# Patient Record
Sex: Female | Born: 1995 | Marital: Single | Smoking: Never smoker
Health system: Southern US, Community
[De-identification: ages and names within clinical notes are randomized; demographics above are authoritative.]

---

## 2016-03-17 ENCOUNTER — Ambulatory Visit (INDEPENDENT_AMBULATORY_CARE_PROVIDER_SITE_OTHER): Payer: BLUE CROSS/BLUE SHIELD | Admitting: Family Medicine

## 2016-03-17 ENCOUNTER — Encounter: Payer: Self-pay | Admitting: Family Medicine

## 2016-03-17 DIAGNOSIS — M25559 Pain in unspecified hip: Secondary | ICD-10-CM

## 2016-03-17 DIAGNOSIS — G8929 Other chronic pain: Secondary | ICD-10-CM

## 2016-03-17 DIAGNOSIS — R200 Anesthesia of skin: Secondary | ICD-10-CM

## 2016-03-17 DIAGNOSIS — R202 Paresthesia of skin: Secondary | ICD-10-CM

## 2016-03-21 ENCOUNTER — Ambulatory Visit
Admission: RE | Admit: 2016-03-21 | Discharge: 2016-03-21 | Disposition: A | Discharge status: Home / Self Care | Payer: BLUE CROSS/BLUE SHIELD | Source: Ambulatory Visit | Attending: Family Medicine | Admitting: Family Medicine

## 2016-03-21 ENCOUNTER — Encounter: Payer: Self-pay | Admitting: Radiology

## 2016-03-21 DIAGNOSIS — R200 Anesthesia of skin: Secondary | ICD-10-CM

## 2016-03-21 DIAGNOSIS — M25559 Pain in unspecified hip: Secondary | ICD-10-CM | POA: Insufficient documentation

## 2016-03-21 DIAGNOSIS — G8929 Other chronic pain: Secondary | ICD-10-CM | POA: Diagnosis not present

## 2016-03-21 DIAGNOSIS — R202 Paresthesia of skin: Secondary | ICD-10-CM | POA: Diagnosis not present

## 2016-04-04 NOTE — Progress Notes (Signed)
Softball catcher presents today with symptoms of discomfort when doing lunges in the weight room and both legs laterally. Patient states that she's had this problem since she was younger. She states that it only happens with doing lunges. It does not happen when she is in her stats to catch. She states the symptoms do not go past her knees. She denies any lower back pain or any inguinal/groin pain. She states that her symptoms resolve within seconds after getting out of the lunge position.  ROS: Negative except mentioned above.  Vitals as per Epic.  GENERAL: NAD RESP: CTA B CARD: RRR MSK: No back midline tenderness, full range of motion of back and hips, negative straight leg raise, negative Faber's, 5 out of 5 strength of lower extremities, normal gait, NV intact NEURO: CN II-XII grossly intact   A/P: Radicular symptoms lower extremities with no back or hip pain - unclear as to the etiology, will do imaging of the lower back and hips, for now I would recommend that the patient avoid doing the action that causes the symptoms. I do recommend that she work on hamstring flexibility and glut. strength as well as course strength. Seek medical attention if any changes in symptoms or worsening symptoms.

## 2016-09-05 ENCOUNTER — Other Ambulatory Visit: Payer: Self-pay | Admitting: Family Medicine

## 2016-09-05 ENCOUNTER — Ambulatory Visit
Admission: RE | Admit: 2016-09-05 | Discharge: 2016-09-05 | Disposition: A | Discharge status: Home / Self Care | Payer: BLUE CROSS/BLUE SHIELD | Source: Ambulatory Visit | Attending: Family Medicine | Admitting: Family Medicine

## 2016-09-05 DIAGNOSIS — W2107XA Struck by softball, initial encounter: Secondary | ICD-10-CM | POA: Insufficient documentation

## 2016-09-05 DIAGNOSIS — T1490XA Injury, unspecified, initial encounter: Secondary | ICD-10-CM

## 2016-09-05 DIAGNOSIS — S8991XA Unspecified injury of right lower leg, initial encounter: Secondary | ICD-10-CM | POA: Diagnosis present

## 2016-10-26 ENCOUNTER — Ambulatory Visit
Admission: RE | Admit: 2016-10-26 | Discharge: 2016-10-26 | Disposition: A | Discharge status: Home / Self Care | Payer: BLUE CROSS/BLUE SHIELD | Source: Ambulatory Visit | Attending: Family Medicine | Admitting: Family Medicine

## 2016-10-26 ENCOUNTER — Other Ambulatory Visit: Payer: Self-pay | Admitting: Family Medicine

## 2016-10-26 ENCOUNTER — Ambulatory Visit
Admission: AD | Admit: 2016-10-26 | Discharge: 2016-10-26 | Disposition: A | Discharge status: Home / Self Care | Payer: BLUE CROSS/BLUE SHIELD | Source: Ambulatory Visit | Attending: Diagnostic Radiology | Admitting: Diagnostic Radiology

## 2016-10-26 DIAGNOSIS — X58XXXA Exposure to other specified factors, initial encounter: Secondary | ICD-10-CM

## 2016-10-26 DIAGNOSIS — S8991XA Unspecified injury of right lower leg, initial encounter: Secondary | ICD-10-CM | POA: Diagnosis present

## 2016-10-26 DIAGNOSIS — W51XXXA Accidental striking against or bumped into by another person, initial encounter: Secondary | ICD-10-CM | POA: Diagnosis not present

## 2016-10-26 DIAGNOSIS — Y929 Unspecified place or not applicable: Secondary | ICD-10-CM | POA: Diagnosis not present

## 2016-10-26 DIAGNOSIS — M25561 Pain in right knee: Secondary | ICD-10-CM | POA: Insufficient documentation

## 2016-10-26 DIAGNOSIS — Y998 Other external cause status: Secondary | ICD-10-CM | POA: Diagnosis not present

## 2016-10-26 DIAGNOSIS — Y9364 Activity, baseball: Secondary | ICD-10-CM | POA: Diagnosis not present

## 2016-10-26 DIAGNOSIS — Z5321 Procedure and treatment not carried out due to patient leaving prior to being seen by health care provider: Secondary | ICD-10-CM | POA: Insufficient documentation

## 2016-10-26 NOTE — ED Triage Notes (Addendum)
Pt states that she was playing softball, sliding into 3rd base and her rt leg was tucked underneath her left with sliding and jammed into the opposing player, pt is having rt outer knee pain, pt was ambulatory to triage with the use of crutches, pt is semi weight bearing at this time, rt leg wrapped prior to arrival by her trainer with an ace bandage. No distress noted at this time, pt declined wheelchair

## 2016-10-27 ENCOUNTER — Other Ambulatory Visit: Payer: Self-pay | Admitting: Family Medicine

## 2016-10-27 DIAGNOSIS — M25561 Pain in right knee: Secondary | ICD-10-CM

## 2016-10-28 ENCOUNTER — Encounter: Payer: Self-pay | Admitting: Family Medicine

## 2016-10-28 ENCOUNTER — Ambulatory Visit (INDEPENDENT_AMBULATORY_CARE_PROVIDER_SITE_OTHER): Payer: BLUE CROSS/BLUE SHIELD | Admitting: Family Medicine

## 2016-10-28 DIAGNOSIS — S8991XD Unspecified injury of right lower leg, subsequent encounter: Secondary | ICD-10-CM

## 2016-10-28 NOTE — Progress Notes (Signed)
Patient presents today for injury to the right knee. Patient's injury happened 2 days ago when sliding into third base playing softball. Patient was unable to weight-bear after the injury. She continues to be unable to weight-bear without his comfort. She has had clicking and popping of the knee since the injury. There is some bruising of the knee and also swelling. Range of motion is limited with flexion according to patient. She denies any injury to the right knee in the past. She does feel some instability of the knee when she does try to weight-bear/walk. She has seen Dr. Ardine Eng, our orthopedic surgeon at [facility], who suggests getting an MRI to look for ligamentous/cartilage injury. He has prescribed her prescription medication for pain. Has been working with training staff to control swelling with [employer].  ROS: Negative except mentioned above. Vitals as per Epic. GENERAL: NAD MSK: R Knee - mild ecchymosis certain areas laterally, mild effusion, limited range of motion with flexion, mild medial and lateral joint line tenderness, no significant laxity with varus or valgus stress, unable to assess for positive Lachman, Drawer, NV intact NEURO: CN II-XII grossly intact   A/P: Right knee injury/trauma - rest, ice, compression, weightbearing as tolerated, x-rays were negative, we'll proceed with MRI as requested by orthopedic surgeon to look for ligamentous/cartilage injury, take prescription medication as needed, patient will seek medical attention if any acute problems.

## 2016-10-29 ENCOUNTER — Ambulatory Visit
Admission: RE | Admit: 2016-10-29 | Discharge: 2016-10-29 | Disposition: A | Discharge status: Home / Self Care | Payer: BLUE CROSS/BLUE SHIELD | Source: Ambulatory Visit | Attending: Family Medicine | Admitting: Family Medicine

## 2016-10-29 DIAGNOSIS — M25461 Effusion, right knee: Secondary | ICD-10-CM | POA: Insufficient documentation

## 2016-10-29 DIAGNOSIS — M25561 Pain in right knee: Secondary | ICD-10-CM | POA: Diagnosis present

## 2016-11-25 ENCOUNTER — Emergency Department
Admission: EM | Admit: 2016-11-25 | Discharge: 2016-11-25 | Disposition: A | Discharge status: Home / Self Care | Payer: BLUE CROSS/BLUE SHIELD | Attending: Emergency Medicine | Admitting: Emergency Medicine

## 2017-04-03 ENCOUNTER — Ambulatory Visit
Admission: RE | Admit: 2017-04-03 | Discharge: 2017-04-03 | Disposition: A | Discharge status: Home / Self Care | Payer: BLUE CROSS/BLUE SHIELD | Source: Ambulatory Visit | Attending: Family Medicine | Admitting: Family Medicine

## 2017-04-03 ENCOUNTER — Ambulatory Visit (INDEPENDENT_AMBULATORY_CARE_PROVIDER_SITE_OTHER): Payer: BLUE CROSS/BLUE SHIELD | Admitting: Family Medicine

## 2017-04-03 ENCOUNTER — Encounter: Payer: Self-pay | Admitting: Family Medicine

## 2017-04-03 DIAGNOSIS — M25521 Pain in right elbow: Secondary | ICD-10-CM | POA: Insufficient documentation

## 2017-04-03 MED ORDER — DICLOFENAC SODIUM 75 MG PO TBEC: 75.0000 mg | DELAYED_RELEASE_TABLET | Freq: Two times a day (BID) | ORAL | 0 refills | Status: AC

## 2017-04-06 ENCOUNTER — Other Ambulatory Visit: Payer: Self-pay | Admitting: Family Medicine

## 2017-04-06 DIAGNOSIS — M25521 Pain in right elbow: Secondary | ICD-10-CM

## 2017-04-07 NOTE — Progress Notes (Signed)
Patient presents today with symptoms of right medial elbow pain. Patient states that she's had symptoms since the spring of this year. The trainer treated her with stretching of the flexor muscle group and strengthening of the forearm. Patient did not do any significant activity during the summer and then started softball practice when she came back to [facility] for the fall. She has noticed that with throwing the ball from the side (valgus stress) she continues to have medial elbow pain and now has started to feel numbness going into her forearm and fourth and fifth digits. She admits to weakness of the forearm as well. She denies any shoulder pain or neck pain. She has felt a popping that happens in the medial aspect of her elbow at times. She has not had any imaging of her elbow.  ROS: Negative except mentioned above. Vitals as per Epic.  GENERAL: NAD MSK: R Elbow - no obvious deformity, FROM, no tenderness on medial or lateral epicondyle, patient does have some discomfort with valgus stress and milking maneuver, no significant instability appreciated, positive Tinel's, NV intact. She has no tenderness at the shoulder or cervical spine NEURO: CN II-XII grossly intact   A/P: Right medial elbow pain -unsure whether patient is having subluxation episodes, modify activities and throwing for now , can do activities that do not cause pain, NSAIDs prescribed, will do imaging with x-ray and MRI, will follow up with Dr. Ardine Eng after images have been reviewed.

## 2017-04-13 ENCOUNTER — Ambulatory Visit
Admission: RE | Admit: 2017-04-13 | Discharge: 2017-04-13 | Disposition: A | Discharge status: Home / Self Care | Payer: BLUE CROSS/BLUE SHIELD | Source: Ambulatory Visit | Attending: Family Medicine | Admitting: Family Medicine

## 2017-04-13 DIAGNOSIS — M25521 Pain in right elbow: Secondary | ICD-10-CM | POA: Diagnosis present

## 2017-05-12 ENCOUNTER — Encounter: Payer: Self-pay | Admitting: Emergency Medicine

## 2017-05-12 ENCOUNTER — Emergency Department
Admission: EM | Admit: 2017-05-12 | Discharge: 2017-05-13 | Disposition: A | Discharge status: Home / Self Care | Payer: BLUE CROSS/BLUE SHIELD | Attending: Emergency Medicine | Admitting: Emergency Medicine

## 2017-05-12 DIAGNOSIS — Z79899 Other long term (current) drug therapy: Secondary | ICD-10-CM | POA: Diagnosis not present

## 2017-05-12 DIAGNOSIS — R509 Fever, unspecified: Secondary | ICD-10-CM | POA: Diagnosis present

## 2017-05-12 DIAGNOSIS — R197 Diarrhea, unspecified: Secondary | ICD-10-CM | POA: Diagnosis not present

## 2017-05-12 DIAGNOSIS — R1010 Upper abdominal pain, unspecified: Secondary | ICD-10-CM | POA: Insufficient documentation

## 2017-05-12 DIAGNOSIS — J111 Influenza due to unidentified influenza virus with other respiratory manifestations: Secondary | ICD-10-CM

## 2017-05-12 DIAGNOSIS — R69 Illness, unspecified: Secondary | ICD-10-CM

## 2017-05-12 LAB — URINALYSIS, ROUTINE W REFLEX MICROSCOPIC
BILIRUBIN URINE: NEGATIVE
Glucose, UA: NEGATIVE mg/dL
HGB URINE DIPSTICK: NEGATIVE
Ketones, ur: 5 mg/dL — AB
Leukocytes, UA: NEGATIVE
Nitrite: NEGATIVE
Protein, ur: NEGATIVE mg/dL
SPECIFIC GRAVITY, URINE: 1.004 — AB (ref 1.005–1.030)
pH: 7 (ref 5.0–8.0)

## 2017-05-12 LAB — CBC WITH DIFFERENTIAL/PLATELET
BASOS ABS: 0 10*3/uL (ref 0–0.1)
BASOS PCT: 0 %
EOS ABS: 0 10*3/uL (ref 0–0.7)
Eosinophils Relative: 1 %
HCT: 39.1 % (ref 35.0–47.0)
HEMOGLOBIN: 13.5 g/dL (ref 12.0–16.0)
Lymphocytes Relative: 20 %
Lymphs Abs: 0.8 10*3/uL — ABNORMAL LOW (ref 1.0–3.6)
MCH: 30.7 pg (ref 26.0–34.0)
MCHC: 34.6 g/dL (ref 32.0–36.0)
MCV: 88.5 fL (ref 80.0–100.0)
Monocytes Absolute: 0.6 10*3/uL (ref 0.2–0.9)
Monocytes Relative: 15 %
NEUTROS ABS: 2.6 10*3/uL (ref 1.4–6.5)
NEUTROS PCT: 64 %
Platelets: 256 10*3/uL (ref 150–440)
RBC: 4.41 MIL/uL (ref 3.80–5.20)
RDW: 13.5 % (ref 11.5–14.5)
WBC: 4 10*3/uL (ref 3.6–11.0)

## 2017-05-12 LAB — COMPREHENSIVE METABOLIC PANEL
ALK PHOS: 58 U/L (ref 38–126)
ALT: 18 U/L (ref 14–54)
ANION GAP: 12 (ref 5–15)
AST: 30 U/L (ref 15–41)
Albumin: 3.6 g/dL (ref 3.5–5.0)
BILIRUBIN TOTAL: 0.8 mg/dL (ref 0.3–1.2)
BUN: <5 mg/dL — ABNORMAL LOW (ref 6–20)
CALCIUM: 8.6 mg/dL — AB (ref 8.9–10.3)
CO2: 24 mmol/L (ref 22–32)
Chloride: 97 mmol/L — ABNORMAL LOW (ref 101–111)
Creatinine, Ser: 1.08 mg/dL — ABNORMAL HIGH (ref 0.44–1.00)
GFR calc non Af Amer: >60 mL/min (ref >60)
Glucose, Bld: 102 mg/dL — ABNORMAL HIGH (ref 65–99)
POTASSIUM: 3.1 mmol/L — AB (ref 3.5–5.1)
Sodium: 133 mmol/L — ABNORMAL LOW (ref 135–145)
TOTAL PROTEIN: 7.9 g/dL (ref 6.5–8.1)

## 2017-05-12 LAB — POCT PREGNANCY, URINE: Preg Test, Ur: NEGATIVE

## 2017-05-12 LAB — LIPASE, BLOOD: LIPASE: 23 U/L (ref 11–51)

## 2017-05-12 LAB — POCT RAPID STREP A: Streptococcus, Group A Screen (Direct): NEGATIVE

## 2017-05-12 MED ORDER — GI COCKTAIL ~~LOC~~
30.0000 mL | ORAL | Status: AC
  Administered 2017-05-13: 30 mL via ORAL
  Filled 2017-05-12: qty 30

## 2017-05-12 MED ORDER — ACETAMINOPHEN 325 MG PO TABS
650.0000 mg | ORAL_TABLET | Freq: Once | ORAL | Status: AC
  Administered 2017-05-12: 650 mg via ORAL
  Filled 2017-05-12: qty 2

## 2017-05-12 MED ORDER — SODIUM CHLORIDE 0.9 % IV BOLUS (SEPSIS)
1000.0000 mL | Freq: Once | INTRAVENOUS | Status: AC
  Administered 2017-05-12: 1000 mL via INTRAVENOUS

## 2017-05-12 MED ORDER — SODIUM CHLORIDE 0.9 % IV BOLUS (SEPSIS)
1000.0000 mL | Freq: Once | INTRAVENOUS | Status: AC
  Administered 2017-05-13: 1000 mL via INTRAVENOUS

## 2017-05-12 MED ORDER — FAMOTIDINE 20 MG PO TABS
40.0000 mg | ORAL_TABLET | Freq: Once | ORAL | Status: AC
  Administered 2017-05-13: 40 mg via ORAL
  Filled 2017-05-12: qty 2

## 2017-05-12 MED ORDER — ONDANSETRON HCL 4 MG/2ML IJ SOLN
4.0000 mg | Freq: Once | INTRAMUSCULAR | Status: AC
  Administered 2017-05-13: 4 mg via INTRAVENOUS
  Filled 2017-05-12: qty 2

## 2017-05-12 NOTE — ED Triage Notes (Signed)
Pt reports fever since Saturday, abdominal pain and diarrhea all day not being able to keep anything down. Pt denies any other symptom pt talks in complete sentences no distress noted

## 2017-05-13 MED ORDER — FAMOTIDINE 20 MG PO TABS: 20.0000 mg | ORAL_TABLET | Freq: Two times a day (BID) | ORAL | 0 refills | Status: AC

## 2017-05-13 MED ORDER — NAPROXEN 500 MG PO TABS: 500.0000 mg | ORAL_TABLET | Freq: Two times a day (BID) | ORAL | 0 refills | Status: DC

## 2017-05-13 MED ORDER — ALUMINUM-MAGNESIUM-SIMETHICONE 200-200-20 MG/5ML PO SUSP: 30.0000 mL | Freq: Three times a day (TID) | ORAL | 0 refills | Status: AC

## 2017-05-13 MED ORDER — ONDANSETRON 4 MG PO TBDP: 4.0000 mg | ORAL_TABLET | Freq: Three times a day (TID) | ORAL | 0 refills | Status: DC | PRN

## 2017-05-13 NOTE — ED Notes (Signed)
Pt given gingerale for PO challenge 

## 2017-05-13 NOTE — ED Notes (Signed)

## 2017-05-13 NOTE — ED Provider Notes (Signed)
[facility] Emergency Department Provider Note  ____________________________________________  Time seen: Approximately 12:58 AM  I have reviewed the triage vital signs and the nursing notes.   HISTORY  Chief Complaint Fever and Diarrhea    HPI Jaime Steele is a 21 y.o. female who complains of upper abdominal pain, nausea without vomiting, diarrhea for the past 2 days. Also has rhinorrhea and nonproductive cough sore throat and body aches. She went to student health that he a lot and was told she had a viral illness, but today she developed a fever to 103 at home. Denies headache or neck stiffness. No chest pain shortness of breath. No rash. No dysuria frequency urgency.  Symptoms are constant, no aggravating or alleviating factors, moderate intensity.     History reviewed. No pertinent past medical history.   There are no active problems to display for this patient.    History reviewed. No pertinent surgical history.   Prior to Admission medications   Medication Sig Start Date End Date Taking? Authorizing Provider  aluminum-magnesium hydroxide-simethicone (MAALOX) 200-200-20 MG/5ML SUSP Take 30 mLs 4 (four) times daily -  before meals and at bedtime by mouth. 05/13/17   Sharman CheekStafford, Hodan Wurtz, MD  diclofenac (VOLTAREN) 75 MG EC tablet Take 1 tablet (75 mg total) by mouth 2 (two) times daily. 04/03/17   Jolene ProvostPatel, Kirtida, MD  famotidine (PEPCID) 20 MG tablet Take 1 tablet (20 mg total) 2 (two) times daily by mouth. 05/13/17   Sharman CheekStafford, Ejay Lashley, MD  naproxen (NAPROSYN) 500 MG tablet Take 1 tablet (500 mg total) 2 (two) times daily with a meal by mouth. 05/13/17   Sharman CheekStafford, Ladarrian Asencio, MD  ondansetron (ZOFRAN ODT) 4 MG disintegrating tablet Take 1 tablet (4 mg total) every 8 (eight) hours as needed by mouth for nausea or vomiting. 05/13/17   Sharman CheekStafford, Dhiren Azimi, MD     Allergies Patient has no known allergies.   No family history on file.  Social History Social  History   Tobacco Use  . Smoking status: Never Smoker  . Smokeless tobacco: Never Used  Substance Use Topics  . Alcohol use: No  . Drug use: No    Review of Systems  Constitutional:  positive as above fever.  ENT:   positive sore throat and rhinorrhea Cardiovascular:   No chest pain or syncope. Respiratory:   No dyspnea positive nonproductivecough. Gastrointestinal:   positive as above upper abdominal pain and diarrhea. No vomiting.  Musculoskeletal:   Negative for focal pain or swelling All other systems reviewed and are negative except as documented above in ROS and HPI.  ____________________________________________   PHYSICAL EXAM:  VITAL SIGNS: ED Triage Vitals  Enc Vitals Group     BP 05/12/17 1939 (!) 92/49     Pulse Rate 05/12/17 1939 (!) 131     Resp 05/12/17 1939 20     Temp 05/12/17 1939 (!) 102.7 F (39.3 C)     Temp Source 05/12/17 1939 Oral     SpO2 05/12/17 1939 99 %     Weight 05/12/17 1939 180 lb (81.6 kg)     Height 05/12/17 1939 5\' 6"  (1.676 m)     Head Circumference --      Peak Flow --      Pain Score 05/12/17 1938 7     Pain Loc --      Pain Edu? --      Excl. in GC? --     Vital signs reviewed, nursing assessments reviewed.   Constitutional:  Alert and oriented. Well appearing and in no distress. Eyes:   No scleral icterus.  EOMI. No nystagmus. No conjunctival pallor. PERRL. ENT   Head:   Normocephalic and atraumatic.   Nose:   No congestion/rhinnorhea.    Mouth/Throat:   MMM, no pharyngeal erythema. No peritonsillar mass.    Neck:   No meningismus. Full ROM. Hematological/Lymphatic/Immunilogical:   No cervical lymphadenopathy. Cardiovascular:   tachycardia heart rate 100. Symmetric bilateral radial and DP pulses.  No murmurs.  Respiratory:   Normal respiratory effort without tachypnea/retractions. Breath sounds are clear and equal bilaterally. No wheezes/rales/rhonchi. Gastrointestinal:   Soft with left upper quadrant  tenderness. Non distended. There is no CVA tenderness.  No rebound, rigidity, or guarding.no organomegaly Genitourinary:   deferred Musculoskeletal:   Normal range of motion in all extremities. No joint effusions.  No lower extremity tenderness.  No edema. Neurologic:   Normal speech and language.  Motor grossly intact. No gross focal neurologic deficits are appreciated.  Skin:    Skin is warm, dry and intact. No rash noted.  No petechiae, purpura, or bullae.  ____________________________________________    LABS (pertinent positives/negatives) (all labs ordered are listed, but only abnormal results are displayed) Labs Reviewed  COMPREHENSIVE METABOLIC PANEL - Abnormal; Notable for the following components:      Result Value   Sodium 133 (*)    Potassium 3.1 (*)    Chloride 97 (*)    Glucose, Bld 102 (*)    BUN <5 (*)    Creatinine, Ser 1.08 (*)    Calcium 8.6 (*)    All other components within normal limits  CBC WITH DIFFERENTIAL/PLATELET - Abnormal; Notable for the following components:   Lymphs Abs 0.8 (*)    All other components within normal limits  URINALYSIS, ROUTINE W REFLEX MICROSCOPIC - Abnormal; Notable for the following components:   Color, Urine YELLOW (*)    APPearance CLEAR (*)    Specific Gravity, Urine 1.004 (*)    Ketones, ur 5 (*)    All other components within normal limits  LIPASE, BLOOD  POC URINE PREG, ED  POCT PREGNANCY, URINE  POCT RAPID STREP A   ____________________________________________   EKG    ____________________________________________    RADIOLOGY  No results found.  ____________________________________________   PROCEDURES Procedures  ____________________________________________     CLINICAL IMPRESSION / ASSESSMENT AND PLAN / ED COURSE  Pertinent labs & imaging results that were available during my care of the patient were reviewed by me and considered in my medical decision making (see chart for  details).   patient well appearing no acute distress, presents with fever and tachycardia that is congruent. Constellation of symptoms highly consistent with a viral syndrome, influenza-like illness. Abdomen overall is benign and reassuring. Low suspicion of STI PID TOA appendicitis pancreatitis cholecystitis or intra-abdominal abscess or peritonitis. No evidence of soft tissue infection, pneumonia, meningitis or encephalitis. Counseled patient on symptom control, hydration. Vital signs normalized after antipyretics and IV fluids in the ED and patient feels much better and is tolerating oral intake.      ____________________________________________   FINAL CLINICAL IMPRESSION(S) / ED DIAGNOSES    Final diagnoses:  Influenza-like illness  Fever, unspecified fever cause      This SmartLink is deprecated. Use AVSMEDLIST instead to display the medication list for a patient.   Portions of this note were generated with dragon dictation software. Dictation errors may occur despite best attempts at proofreading.    Sharman CheekStafford, Irini Leet, MD  05/13/17 0103  

## 2017-05-15 ENCOUNTER — Ambulatory Visit (INDEPENDENT_AMBULATORY_CARE_PROVIDER_SITE_OTHER): Payer: BLUE CROSS/BLUE SHIELD | Admitting: Family Medicine

## 2017-05-15 ENCOUNTER — Encounter: Payer: Self-pay | Admitting: Family Medicine

## 2017-05-15 VITALS — BP 127/78 | HR 102 | Temp 98.1°F | Resp 14

## 2017-05-15 DIAGNOSIS — J069 Acute upper respiratory infection, unspecified: Secondary | ICD-10-CM

## 2017-05-15 DIAGNOSIS — H6691 Otitis media, unspecified, right ear: Secondary | ICD-10-CM

## 2017-05-15 LAB — POCT INFLUENZA A/B
Influenza A, POC: NEGATIVE
Influenza B, POC: NEGATIVE

## 2017-05-15 MED ORDER — AZITHROMYCIN 250 MG PO TABS: ORAL_TABLET | ORAL | 0 refills | Status: AC

## 2017-05-15 NOTE — Addendum Note (Signed)
Addended by: Dione HousekeeperPATEL, Dorathy Stallone N on: 05/15/2017 09:47 AM   Modules accepted: Orders

## 2017-05-15 NOTE — Progress Notes (Signed)
Patient presents today with symptoms of mild productive cough, postnasal drip, right ear pain. Patient states that she was seen in the ER a few days ago with a high fever and GI symptoms and URI symptoms. Patient was told that she had a virus. A flu screen was not done. Patient states that her temperature now is around 99. She continues to have a bad cough mostly at night. She is now noticed right ear pain which she did not have a few days ago. She denies any significant chest pain or shortness of breath. She states her GI symptoms have resolved. She has been taking Tessalon Perles for her cough. She denies asthma.  ROS: Negative except mentioned above. Vitals as per Epic. GENERAL: NAD HEENT: mild pharyngeal erythema, no exudate, moderate erythema of right TM, no erythema of left TM, no drainage from the ears, no cervical LAD RESP: CTA B CARD: RRR ABD: Positive bowel sounds, nontender, no rebound or guarding appreciated, no flank tenderness NEURO: CN II-XII grossly intact   A/P: Right Otitis Media, URI - influenza test was negative in the office, will treat with Z-Pak, can take Delsym during the daytime for cough and take Tussionex at night if needed. Can try Claritin for postnasal drip. If symptoms do persist or worsen I have recommended that she seek medical attention while she is at home over Thanksgiving break. Patient addresses understanding.

## 2017-08-04 IMAGING — CR DG KNEE COMPLETE 4+V*R*
1 series · 5 of 5 positions shown · non-contrast
Comparison: None.

CLINICAL DATA: Injured playing softball.  Lateral pain.

EXAM:
RIGHT KNEE - COMPLETE 4+ VIEW

[Series 3: t knee ap right · 0.14mm/px · 5 of 5 slices shown]
[im 1/5]
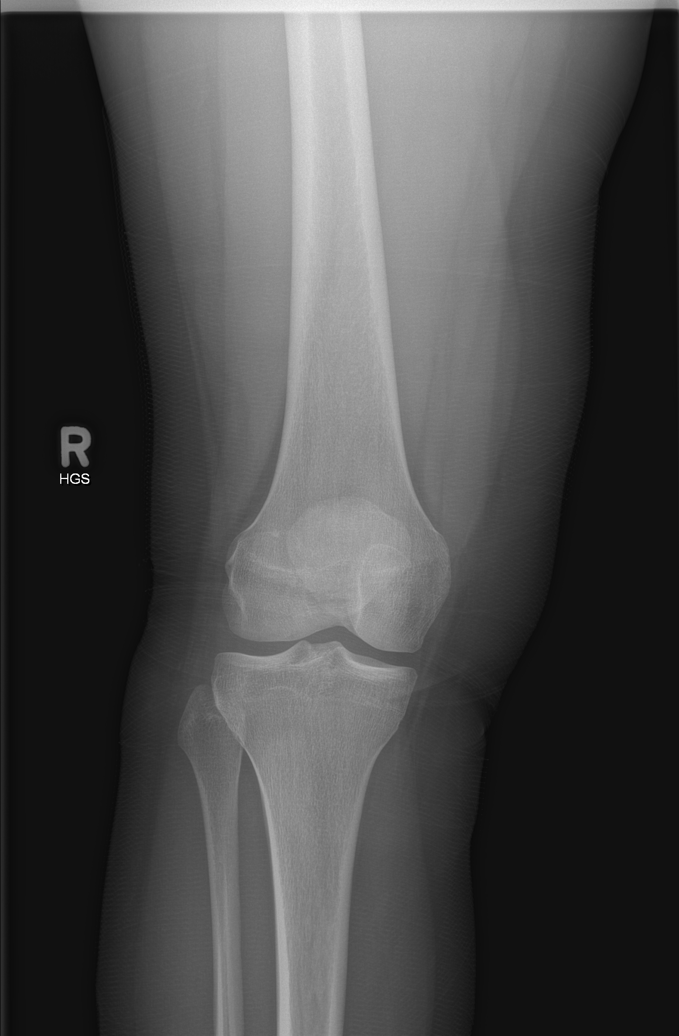
[im 2/5]
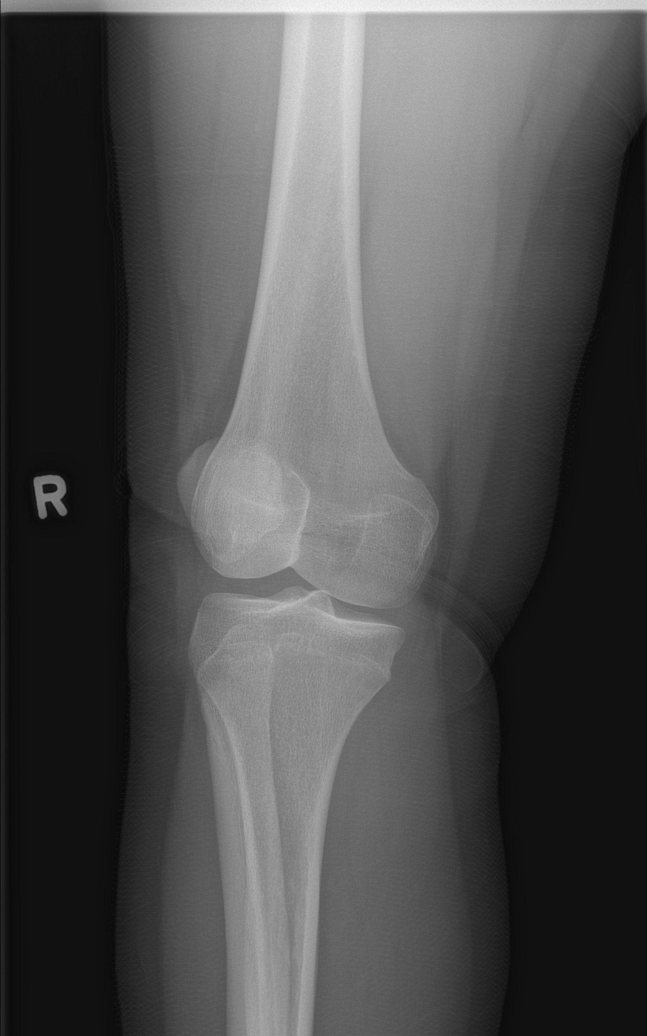
[im 3/5]
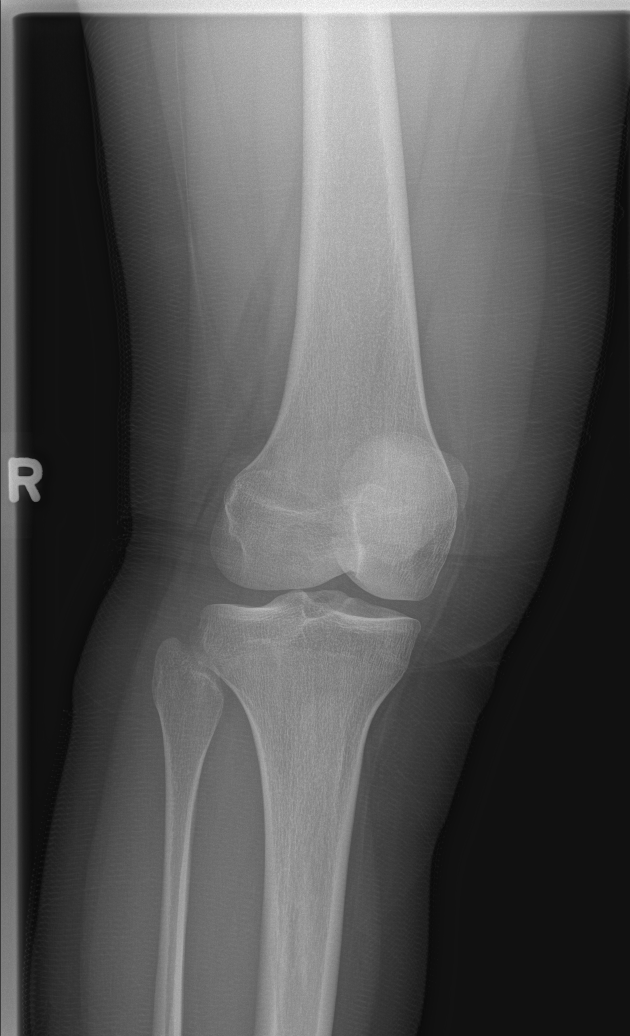
[im 4/5]
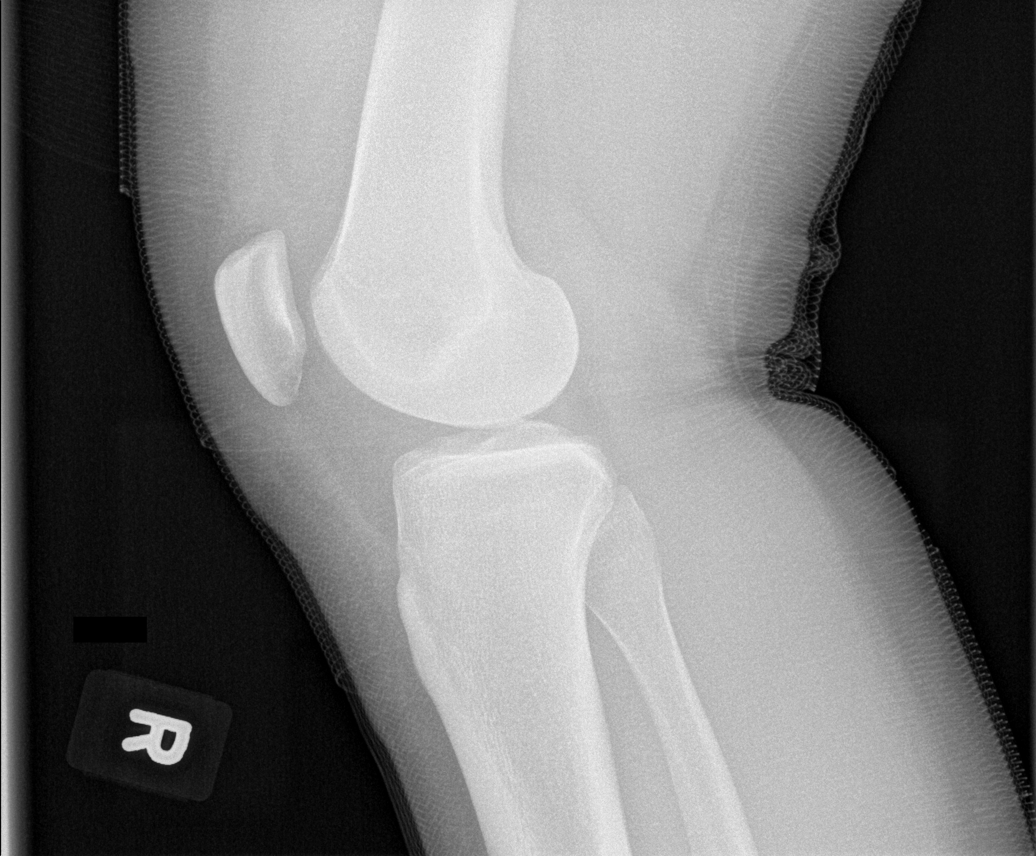
[im 5/5]
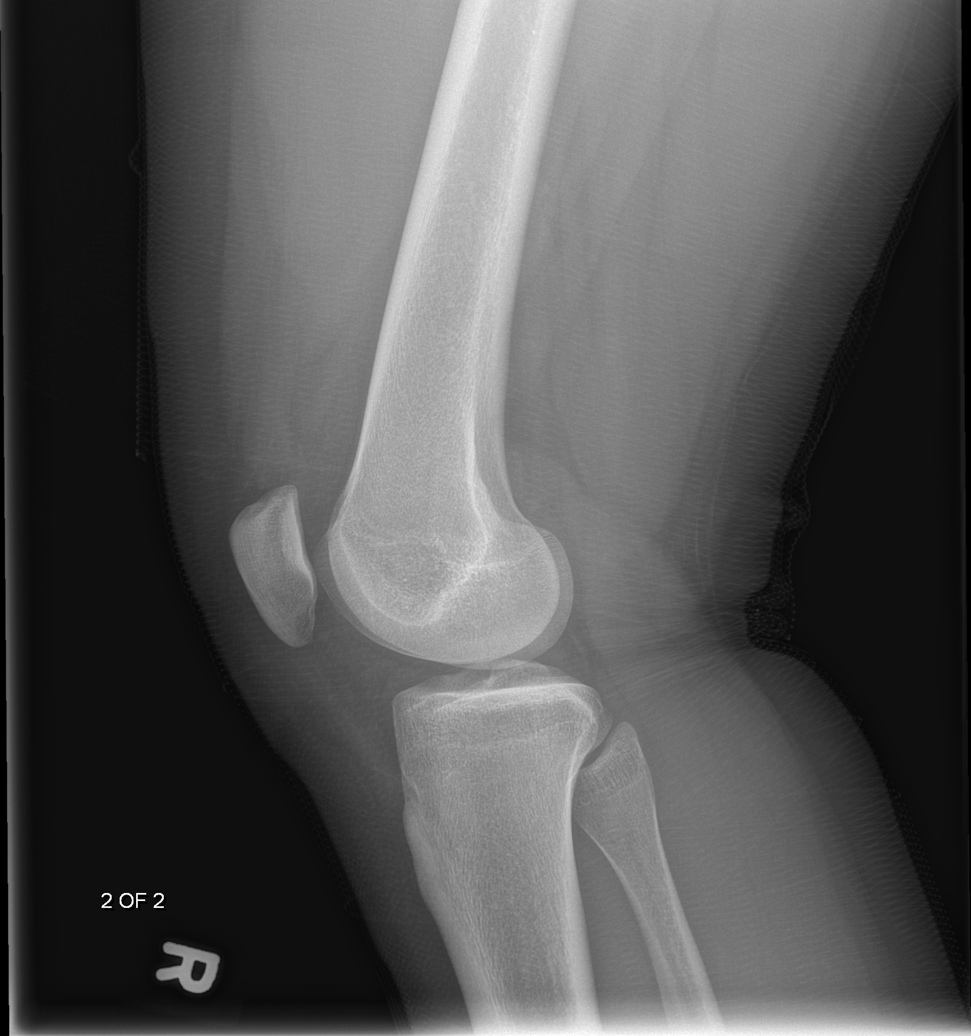

[5 of 5 positions shown; findings below may reference images not displayed]

FINDINGS: No evidence of fracture, dislocation, or joint effusion. No evidence
of arthropathy or other focal bone abnormality. Soft tissues are
unremarkable.
IMPRESSION: Normal

## 2018-06-14 ENCOUNTER — Ambulatory Visit: Payer: BC Managed Care – PPO | Attending: Psychiatry | Admitting: Psychiatry

## 2018-06-14 ENCOUNTER — Encounter (INDEPENDENT_AMBULATORY_CARE_PROVIDER_SITE_OTHER): Payer: Self-pay | Admitting: Psychiatry

## 2018-06-14 ENCOUNTER — Ambulatory Visit
Admission: RE | Admit: 2018-06-14 | Discharge: 2018-06-14 | Disposition: A | Discharge status: Home / Self Care | Payer: BC Managed Care – PPO | Source: Ambulatory Visit | Attending: Psychiatry | Admitting: Psychiatry

## 2018-06-14 ENCOUNTER — Telehealth (INDEPENDENT_AMBULATORY_CARE_PROVIDER_SITE_OTHER): Payer: Self-pay

## 2018-06-14 DIAGNOSIS — Z008 Encounter for other general examination: Secondary | ICD-10-CM | POA: Insufficient documentation

## 2018-06-14 DIAGNOSIS — F99 Mental disorder, not otherwise specified: Secondary | ICD-10-CM | POA: Insufficient documentation

## 2018-06-14 DIAGNOSIS — F902 Attention-deficit hyperactivity disorder, combined type: Secondary | ICD-10-CM | POA: Insufficient documentation

## 2018-06-14 LAB — VH URINE DRUG SCREEN
Amphetamine: NEGATIVE
Barbiturates: NEGATIVE
Cannabinoids: NEGATIVE
Cocaine: NEGATIVE
Methamphetamine: NEGATIVE
Opiates: NEGATIVE
Phencyclidine: NEGATIVE
Propoxyphene: NEGATIVE
Urine Benzodiazepines: NEGATIVE
Urine Buprenorphine: NEGATIVE
Urine Methadone Screen: NEGATIVE
Urine Oxycodone: NEGATIVE
Urine Tricyclics: NEGATIVE

## 2018-06-14 MED ORDER — ATOMOXETINE HCL 40 MG PO CAPS
40.00 mg | ORAL_CAPSULE | Freq: Every day | ORAL | 1 refills | Status: DC
Start: 2018-06-14 — End: 2018-07-27

## 2018-06-14 NOTE — Telephone Encounter (Signed)
-----   Message from Audree Camel, MD sent at 06/14/2018  4:00 PM EST -----  Please let her know this is all negative as she expected.

## 2018-06-14 NOTE — Patient Instructions (Signed)
Atomoxetine capsules  Brand Name: Strattera  What is this medicine?  ATOMOXETINE (AT oh mox e teen) is used to treat attention deficit/hyperactivity disorder, also known as ADHD. It is not a stimulant like other drugs for ADHD. This drug can improve attention span, concentration, and emotional control. It can also reduce restless or overactive behavior.  How should I use this medicine?  Take this medicine by mouth with a glass of water. Follow the directions on the prescription label. You can take it with or without food. If it upsets your stomach, take it with food. If you have difficulty sleeping and you take more than 1 dose per day, take your last dose before 6 PM. Take your medicine at regular intervals. Do not take it more often than directed. Do not stop taking except on your doctor's advice.  A special MedGuide will be given to you by the pharmacist with each prescription and refill. Be sure to read this information carefully each time.  Talk to your pediatrician regarding the use of this medicine in children. While this drug may be prescribed for children as young as 6 years for selected conditions, precautions do apply.  What side effects may I notice from receiving this medicine?  Side effects that you should report to your doctor or health care professional as soon as possible:   allergic reactions like skin rash, itching or hives, swelling of the face, lips, or tongue   breathing problems   chest pain   dark urine   fast, irregular heartbeat   general ill feeling or flu-like symptoms   high blood pressure   males: prolonged or painful erection   stomach pain or tenderness   trouble passing urine or change in the amount of urine   vomiting   weight loss   yellowing of the eyes or skin  Side effects that usually do not require medical attention (report to your doctor or health care professional if they continue or are bothersome):   change in sex drive or performance   constipation or  diarrhea   headache   loss of appetite   menstrual period irregularities   nausea   stomach upset  What may interact with this medicine?  Do not take this medicine with any of the following medications:   cisapride   dofetilide   dronedarone   MAOIs like Carbex, Eldepryl, Marplan, Nardil, and Parnate   pimozide   reboxetine   thioridazine   ziprasidone  This medicine may also interact with the following medications:   certain medicines for blood pressure, heart disease, irregular heart beat   certain medicines for depression, anxiety, or psychotic disturbances   certain medicines for lung disease like albuterol   cold or allergy medicines   fluoxetine   medicines that increase blood pressure like dopamine, dobutamine, or ephedrine   other medicines that prolong the QT interval (cause an abnormal heart rhythm)   paroxetine   quinidine   stimulant medicines for attention disorders, weight loss, or to stay awake  What if I miss a dose?  If you miss a dose, take it as soon as you can. If it is almost time for your next dose, take only that dose. Do not take double or extra doses.  Where should I keep my medicine?  Keep out of the reach of children.  Store at room temperature between 15 and 30 degrees C (59 and 86 degrees F). Throw away any unused medication after the expiration date.    What should I tell my health care provider before I take this medicine?  They need to know if you have any of these conditions:   glaucoma   high or low blood pressure   history of stroke   irregular heartbeat or other cardiac disease   liver disease   mania or bipolar disorder   pheochromocytoma   suicidal thoughts   an unusual or allergic reaction to atomoxetine, other medicines, foods, dyes, or preservatives   pregnant or trying to get pregnant   breast-feeding  What should I watch for while using this medicine?  It may take a week or more for this medicine to take effect. This is why it is very  important to continue taking the medicine and not miss any doses. If you have been taking this medicine regularly for some time, do not suddenly stop taking it. Ask your doctor or health care professional for advice.  Rarely, this medicine may increase thoughts of suicide or suicide attempts in children and teenagers. Call your child's health care professional right away if your child or teenager has new or increased thoughts of suicide or has changes in mood or behavior like becoming irritable or anxious. Regularly monitor your child for these behavioral changes.  For males, contact you doctor or health care professional right away if you have an erection that lasts longer than 4 hours or if it becomes painful. This may be a sign of serious problem and must be treated right away to prevent permanent damage.  You may get drowsy or dizzy. Do not drive, use machinery, or do anything that needs mental alertness until you know how this medicine affects you. Do not stand or sit up quickly, especially if you are an older patient. This reduces the risk of dizzy or fainting spells. Alcohol can make you more drowsy and dizzy. Avoid alcoholic drinks.  Do not treat yourself for coughs, colds or allergies without asking your doctor or health care professional for advice. Some ingredients can increase possible side effects.  Your mouth may get dry. Chewing sugarless gum or sucking hard candy, and drinking plenty of water will help.  NOTE:This sheet is a summary. It may not cover all possible information. If you have questions about this medicine, talk to your doctor, pharmacist, or health care provider. Copyright 2019 [employer]

## 2018-06-14 NOTE — Progress Notes (Signed)
[facility]  Initial Psychiatric Evaluation      Patient name:  Rhonda Pitts  MRN:  [id number]  DOB:  June 17, 1996    06/14/2018    Rhonda Pitts is a 22 y.o. female, presented for initial evaluation.       Chief Complaint   Patient presents with   . Establish Care       History of Present Illness:  This 22 year old single white female [employer] presents to be evaluated for potential ADHD.    She reports no history of depression, no psychosis, no suicidal thoughts.  There is no evidence of hypomania or mania.    She does acknowledge that she feels like she is quite anxious and perhaps more anxious than other people.  She believes that she always worries and that she always worries more than her friends that have things to be concerned about.  She notes that at times her anxiety leads her to have more headaches.  Speaking with her mother, her mother surprised to hear this but does not dispute it.    In terms of ADHD, speaking with her mother by telephone she agrees that there may have always been some element of symptomatology.  Rhonda Pitts did well in high school and got A's and always did well academically.  She channeled most of her energy into sports and softball and did quite well with this.  The patient states that the issues she has in school is typically the classes are much longer.  The longer classes the more trouble she has focusing and maintaining her attention on it.  She also acknowledges that it is harder academically for her.  Rhonda Pitts reported symptoms of ADHD. Rhonda Pitts reported severity of sx is  worsened. Rhonda Pitts reported symptoms occur approximately daily and typical duration is continuous.  In terms of ADHD symptom:  Location & Context         []  Rhonda Pitts reported these symptoms of typically present:        [x]  Rhonda Pitts reported symptoms affect academic ability.        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)       []  Rhonda Pitts reported  symptoms affect relationship with:     Rhonda Pitts reported the following modifying factors:         []  Rhonda Pitts reported no modifying factors       [x]  Rhonda Pitts reported palliative factors include: being devotedly 1:1       [x]  Rhonda Pitts reported provocative factors include: longer classes, longer inactivity, lots of talking    Rhonda Pitts reported the following associated factors:          [x]  making careless mistakes     [x]  difficulty sustaining attention       []  not listening when spoken to     []  tasks or projects are often not finished       []  being disorganized     [x]  difficulty with tasks that require sustained mental effort       [x]  tending to lose things     [x]  being easily distracted     [x]  being often forgetful       []  poor attention to details     [x]  often inattentive     [x]  frequently changing activity       []  work often left incomplete     []  work is messy       [  x] needing frequent redirection to tasks at hand     [x]  short attention span       [x]  being restless     [x]  often out of seat     []  difficultly being quiet       [x]  feeling as if "driven by a motor"      [x]  being over talkative     [x]  being a risk taker       [x]  becoming bored easily     [x]  excessive movement       []  feelings of inner restlessness     []  blurting out answers       [x]  difficulty waiting for own turn     []  interrupting others     [x]  difficulty accepting limits       [x]  leaving seat often     []  often acting recklessly      Substance Abuse: None reported    Past Psych Hx: No prior treatment.     Legal Issues: None reported    Developmental HX: Patient was born and raised in the [place].  She is always done well in high school and did well in athletics and is now playing softball at the [employer].  She has been quite successful in her athletic career.  She has a brother.  She has a remote family history of ADHD.      MSE: Patient is pleasant and cooperative.  She is dressed casually but  neatly and cleanly.  She has eye contact which is very good and she is very polite.  Her thoughts are linear.  Her word choices good and her fund of knowledge is good.  Her mood is a little bit anxious.  Her affect is slightly anxious.  There is no suicidal or homicidal ideation noted.  There is no psychosis.  Her insight and judgment are good.    PHQ9 : 3  CAGE: 0      Vitals:    06/14/18 1355   BP: 128/74   Pulse: 72     Weight: 83.9 kg (185 lb)     Medications on File Prior to visit:    Current Outpatient Medications on File Prior to Visit   Medication Sig Dispense Refill   . norgestimate-ethinyl estradiol (ORTHO-CYCLEN) 0.25-35 MG-MCG per tablet Take 1 tablet by mouth daily       No current facility-administered medications on file prior to visit.      No Known Allergies    History reviewed. No pertinent past medical history.  Family History   Problem Relation Age of Onset   . ADD / ADHD Maternal Grandmother      Social History     Socioeconomic History   . Marital status: Single     Spouse name: Not on file   . Number of children: 0   . Years of education: 43   . Highest education level: High school graduate   Occupational History   . Occupation: Archivist   Social Needs   . Financial resource strain: Not on file   . Food insecurity:     Worry: Not on file     Inability: Not on file   . Transportation needs:     Medical: Not on file     Non-medical: Not on file   Tobacco Use   . Smoking status: Never Smoker   . Smokeless tobacco: Never Used   Substance and Sexual Activity   .  Alcohol use: Yes     Alcohol/week: 2.0 standard drinks     Types: 2 Shots of liquor per week   . Drug use: Never   . Sexual activity: Yes     Partners: Male     Birth control/protection: Pill   Lifestyle   . Physical activity:     Days per week: Not on file     Minutes per session: Not on file   . Stress: Not on file   Relationships   . Social connections:     Talks on phone: Not on file     Gets together: Not on file     Attends  religious service: Not on file     Active member of club or organization: Not on file     Attends meetings of clubs or organizations: Not on file     Relationship status: Not on file   . Intimate partner violence:     Fear of current or ex partner: Not on file     Emotionally abused: Not on file     Physically abused: Not on file     Forced sexual activity: Not on file   Other Topics Concern   . Not on file   Social History Narrative   . Not on file             Assessment - Diagnosis - Goals:   Andie was seen today for establish care.    Diagnoses and all orders for this visit:    Psychiatric problem    ADHD (attention deficit hyperactivity disorder), combined type  -     Urine Drug Screen; Future    Other orders  -     atomoxetine (STRATTERA) 40 MG capsule; Take 1 capsule (40 mg total) by mouth daily        Axis II: none  Axis III: See above  Axis IV: Mild  Axis V: 60                  Treatment Goals:                [] Restore  function and prevent recurrence        [x] Strive to improve function and prevent recurrence or worsening of symptoms        [] Maintain function and prevent recurrence      [] Other:             Plan:       Treatment Plan:    We discussed diagnosis and treatment planning process with client.      Client is capable of understanding the risks and benefits of treatment recommendations, sharing or withholding information.       A substance abuse disorder was not identifed.    Lab work and prescriptions noted above.  Medications Changes proposed:   Trial of Strattera 40 mg in the evening.  She should call in several weeks so that we can increase the dose to 60 mg if this is not effective.  We chose Strattera because her softball schedule makes it seem difficult to manage in a controlled substance treatment.  She also has some anxiousness and worry her to find a treatment that will not make anxiety worse.    Seeking urine drug screen  PMP is appropriate June 14, 2018    Therapy recommended: Not  indicated  Return in about 6 weeks (around 07/26/2018).  Patient is aware to contact office sooner if needed.  Pt education materials provided: [employer]     The potential risks of these medications in pregnancy were discussed.  The patient currently does not desire to become pregnant and  is taking precautions to avoid pregnancy.  If pregnancy occurs or the patient desires to become pregnant she will address this with the clinical staff.  Further she is made aware of the www.womensmentalhealth.org as a resource.    Review with patient:   The nature of this outpatient practice and how to contact us for routine issues as well as how to seek higher levels of care if needed were discussed.  Discussed risks and benefits of treatment options as well as potential side effects and medication interactions. Patient agreed to continue with safety plan which includes calling 911 and/or going to ER if needed. All questions answered and concerns addressed.     Audree Camel, MD      This note was completed using rising Dragon Medical speech recognition software. Grammatical errors, random word insertions, pronoun errors and incomplete sentences are occasional consequences of this technology due to software limitations. If there are questions or concerns about the content of this note or information contained within the body of this dictation they should be addressed with the provider for clarification.

## 2018-07-27 ENCOUNTER — Ambulatory Visit: Payer: BC Managed Care – PPO | Attending: Psychiatry | Admitting: Psychiatry

## 2018-07-27 ENCOUNTER — Encounter (INDEPENDENT_AMBULATORY_CARE_PROVIDER_SITE_OTHER): Payer: Self-pay | Admitting: Psychiatry

## 2018-07-27 DIAGNOSIS — F902 Attention-deficit hyperactivity disorder, combined type: Secondary | ICD-10-CM | POA: Insufficient documentation

## 2018-07-27 MED ORDER — ATOMOXETINE HCL 40 MG PO CAPS
40.00 mg | ORAL_CAPSULE | Freq: Every day | ORAL | 4 refills | Status: DC
Start: 2018-07-27 — End: 2018-11-23

## 2018-07-27 NOTE — Progress Notes (Signed)
[facility]  Follow up visit    Patient name:  Rhonda Pitts  MRN:  [id number]  DOB:  08/08/1995    07/27/2018    Rhonda Pitts is a 23 y.o. female, presented for follow-up appointment.    Others Present in Session include: none    Chief Complaint:    Chief Complaint   Patient presents with    Medication Management     Major Problem: ADHD  Doing quite well with Rhonda Pitts  Focusing better with school, grade on January term much better than anticipated,   UDS- appropriate  This problem is chronic and stable and improving  Issues continue in the context of straterra trial  Issues continue over   Associated Symptoms include no unusual behaviors, sig gains with straterra    Adverse events associated with medication include : none        Wt Readings from Last 3 Encounters:   07/27/18 82.6 kg (182 lb)   06/14/18 83.9 kg (185 lb)     Medications on File Prior to visit:    Current Outpatient Medications on File Prior to Visit   Medication Sig Dispense Refill    [DISCONTINUED] atomoxetine (STRATTERA) 40 MG capsule Take 1 capsule (40 mg total) by mouth daily 30 capsule 1    norgestimate-ethinyl estradiol (ORTHO-CYCLEN) 0.25-35 MG-MCG per tablet Take 1 tablet by mouth daily       No current facility-administered medications on file prior to visit.      No Known Allergies    Past medical history, family history and social history on file, reviewed.     Review Of Systems:     Neurological:  [x]  There is no recent history of h/a, weakness, dizziness or other abnormal neurological symptoms not otherwise discussed above.   []  Abnormality:    Constitutional:  [x]  There is no recent history of fever, malaise, or other abnormal constitutional symptoms not otherwise discussed above.   []  Abnormality:      Current Evaluation:     Mental Status Evaluation:  General/Appearance:  [x] age appropriate    [] bearded    [x] casually dressed       [] defiant     [x] cooperative [] disheveled    [] older than stated age    [] overweight     [] piercings    [] tattooed    [] thin & gaunt looking    [] well dressed    [] younger than stated age     [x] good eye contact    [] avoidant eye contact    [] hesitant   [] Other:    Gait:    [x] normal gait    [] gait abnormality:   Behavior/Psychomotor:    [x] Normal   [] psychomotor agitation    [] psychomotor retardation    [] restless     [] fidgety     [] tics  [] rigidity  [] flaccid       [] akathesia    [] choreaoathetoid movt   [] Other:    Speech:      [x] Normal pitch     [x] normal volume    [] articulation error    [] delayed    [] increased latency of response     [] loud    [] pressured    [] profane     [] soft [] perseveration  [] Other:    Mood:  [x]  Normal     [] angry    [] anxious    [] constricted    [] decreased range     [] depressed    [] dysthymic    [] euphoric    [] euthymic    [] irritable    []   labile     [] sad   [] Other:    Affect:      [x] congruent with mood      [x] full      [] constricted      [] guarded      [] flat      [] blunted      [] expansive   [] Other:    Thought Process /Attention /Concentration:      [x] Normal    []  blocked    [] circumstantial     [] concrete    [] flight of ideas     [x] goal directed    [] loose associations     [] tangential       [] distractable      [] inattentive       [x] associations intact [x]  abstract reasoning intact   Thought Content:     [] delusions    [] obsessions        [x]  absent of suicidal or homocidal ideation   [x] absent of psychotic symptoms  []  A/H     [] paranoia          [] obsessions      [] suicidal ideation      [] suicidal plan      [] suicidal intent      [] passive suicidal ideation      [] homicidal ideation      [] homicidal plan      [] homicidal intent  [] Other:    Perception /Sensorium:         [x] alert     [] drowsy      [x] oriented to person    [x] oriented to place     [x] oriented to time     [x] oriented to situation          [x] does not appear to be reacting to internal or external stimuli today       [] appears to be reacting to internal or external stimuli  [] Other:     Language:  [x] age appropriate    []  naming okay   []  repetition    Fund of knowledge:  [x] age appropriate    [x]  adequate   [] in adequate []  above average   Cognition/Memory:        [x]  cognition grossly intact       []  cognition impaired due to:   []  immediate recall deficit  []  recent memory deficit  [] delayed memory deficit   [] MMSE:   [] MOCA:    Insight:        [x] age appropriate      [x] fair       [] good       [] limited   Judgment:        [x] age appropriate      [x] fair       [] good       [] limited        Assessment  Diagnosis  Goals:      Rhonda Pitts was seen today for medication management.    Diagnoses and all orders for this visit:    ADHD (attention deficit hyperactivity disorder), combined type    Other orders  -     atomoxetine (STRATTERA) 40 MG capsule; Take 1 capsule (40 mg total) by mouth daily                      Treatment Goals:                [] Restore  function and prevent recurrence        [] Strive to improve function and prevent recurrence or  worsening of symptoms        [x] Maintain function and prevent recurrence      [] Other:         Plan:       Treatment Plan:  Cont straterra 40mg  significant benefit        [x]  Continue Medication Management      Associated Prescriptions and orders are noted above.        []  Therapy recommendation: continue   Declined  Strongly encouraged      []  Patient rejected psychotherapy recommendation.         Return in about 4 months (around 11/25/2018).        Review with patient:   Discussed risks and benefits of treatment options as well as potential side effects and medication interactions. We discussed the potential benefits of medications, potential alternatives to medications, and the potential adverse outcomes with or without medications where applicable. Patient agreed to continue with safety plan which includes calling 911 and/or going to ER if needed. All questions answered and concerns addressed.     Audree Camel, MD    This note was completed using   Dragon Medical speech recognition software. Grammatical errors, random word insertions, pronoun errors and incomplete sentences are occasional consequences of this technology due to software limitations. If there are questions or concerns about the content of this note or information contained within the body of this dictation they should be addressed with the provider for clarification.

## 2018-08-03 ENCOUNTER — Ambulatory Visit
Admission: RE | Admit: 2018-08-03 | Discharge: 2018-08-03 | Disposition: A | Discharge status: Home / Self Care | Payer: BLUE CROSS/BLUE SHIELD | Attending: Family Medicine | Admitting: Family Medicine

## 2018-08-03 ENCOUNTER — Other Ambulatory Visit: Payer: Self-pay | Admitting: Family Medicine

## 2018-08-03 ENCOUNTER — Encounter: Payer: Self-pay | Admitting: Family Medicine

## 2018-08-03 ENCOUNTER — Ambulatory Visit
Admission: RE | Admit: 2018-08-03 | Discharge: 2018-08-03 | Disposition: A | Discharge status: Home / Self Care | Payer: BLUE CROSS/BLUE SHIELD | Source: Ambulatory Visit | Attending: Family Medicine | Admitting: Family Medicine

## 2018-08-03 ENCOUNTER — Ambulatory Visit (INDEPENDENT_AMBULATORY_CARE_PROVIDER_SITE_OTHER): Payer: BLUE CROSS/BLUE SHIELD | Admitting: Family Medicine

## 2018-08-03 VITALS — Temp 98.4°F | Resp 14

## 2018-08-03 DIAGNOSIS — M79661 Pain in right lower leg: Secondary | ICD-10-CM | POA: Insufficient documentation

## 2018-08-03 MED ORDER — NAPROXEN 500 MG PO TABS: 500.0000 mg | ORAL_TABLET | Freq: Two times a day (BID) | ORAL | 0 refills | Status: AC

## 2018-08-03 MED ORDER — NAPROXEN 500 MG PO TABS: 500.0000 mg | ORAL_TABLET | Freq: Two times a day (BID) | ORAL | 0 refills | Status: DC

## 2018-08-04 LAB — VITAMIN D 25 HYDROXY (VIT D DEFICIENCY, FRACTURES): Vit D, 25-Hydroxy: 26.8 ng/mL — ABNORMAL LOW (ref 30.0–100.0)

## 2018-08-05 NOTE — Progress Notes (Signed)
Patient presents today with symptoms of right shin pain for the past week.  Patient admits that she normally has shinsplints but the pain on the right has increased.  She denies any trauma or injury to the area.  Denies any other physical activity outside of practice with softball.  She denies any history of stress fracture in the past.  She thought initially that her symptoms began when she started to use new cleats.  She changed back to her old ones but still had the pain.  She denies any menstrual irregularities.  She denies any restrictive or disordered eating.  She denies any weight gain or weight loss recently.  She denies any known history of vitamin D deficiency.  ROS: Negative except mentioned above. Vitals as per Epic.  GENERAL: NAD MSK: R LE - tenderness along mid-distal right tibia, mild discomfort along the area with plantar flexion, normal gait, +hop test, nv intact  NEURO: CN II-XII grossly intact   A/P: R LE Pain - concerns for stress injury, will get x-rays, NSAIDs prescribed, if x-ray is negative for stress fracture crosstraining activities as tolerated, athletic trainer will assess patient daily, if symptoms do persist/worsen would recommend further imaging with MRI if needed.  Will also draw a vitamin D level today.

## 2018-11-23 ENCOUNTER — Ambulatory Visit: Payer: BC Managed Care – PPO | Attending: Psychiatry | Admitting: Psychiatry

## 2018-11-23 DIAGNOSIS — F902 Attention-deficit hyperactivity disorder, combined type: Secondary | ICD-10-CM | POA: Insufficient documentation

## 2018-11-23 MED ORDER — ATOMOXETINE HCL 60 MG PO CAPS
60.00 mg | ORAL_CAPSULE | Freq: Every day | ORAL | 5 refills | Status: DC
Start: 2018-11-23 — End: 2019-09-26

## 2018-11-23 NOTE — Progress Notes (Signed)
[facility]  Follow up visit    Patient name:  Rhonda Pitts  MRN:  [id number]  DOB:  July 15, 1995    11/23/2018    Rhonda Pitts is a 23 y.o. female, presented for follow-up appointment.    Others Present in Session include: none    Chief Complaint:    Chief Complaint   Patient presents with    Medication Management   This visit was conducted with the use of interactive audio/video telecommunication that permitted real time communication between [facility] and Audree Camel, MD. She consented to participation and received services at home while I was located at [facility] at [facility].    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[employer]"  Focusing better with school, grade on January term much better than anticipated,   UDS- appropriate 12/19  This problem is chronic and stable and some losses of focus  Issues continue in the context of straterra trial  Issues continue over   Associated Symptoms include no unusual behaviors, sig gains with straterra    Adverse events associated with medication include : none        Wt Readings from Last 3 Encounters:   07/27/18 82.6 kg (182 lb)   06/14/18 83.9 kg (185 lb)     Medications on File Prior to visit:    Current Outpatient Medications on File Prior to Visit   Medication Sig Dispense Refill    norgestimate-ethinyl estradiol (ORTHO-CYCLEN) 0.25-35 MG-MCG per tablet Take 1 tablet by mouth daily      [DISCONTINUED] atomoxetine (STRATTERA) 40 MG capsule Take 1 capsule (40 mg total) by mouth daily 30 capsule 4     No current facility-administered medications on file prior to visit.      No Known Allergies    Past medical history, family history and social history on file, reviewed.     Review Of Systems:     Neurological:  [x]  There is no recent history of h/a, weakness, dizziness or other abnormal neurological  symptoms not otherwise discussed above.   []  Abnormality:    Constitutional:  [x]  There is no recent history of fever, malaise, or other abnormal constitutional symptoms not otherwise discussed above.   []  Abnormality:      Current Evaluation:     Mental Status Evaluation:  General/Appearance:  [x] age appropriate    [] bearded    [x] casually dressed       [] defiant     [x] cooperative [] disheveled    [] older than stated age    [] overweight    [] piercings    [] tattooed    [] thin & gaunt looking    [] well dressed    [] younger than stated age     [x] good eye contact    [] avoidant eye contact    [] hesitant   [] Other:    Gait:    [x] normal gait    [] gait abnormality:   Behavior/Psychomotor:    [x] Normal   [] psychomotor agitation    [] psychomotor retardation    [] restless     [] fidgety     [] tics  [] rigidity  [] flaccid       [] akathesia    [] choreaoathetoid movt   [] Other:    Speech:      [x] Normal pitch     [x] normal volume    [] articulation error    [] delayed    [] increased  latency of response     [] loud    [] pressured    [] profane     [] soft [] perseveration  [] Other:    Mood:  [x]  Normal     [] angry    [] anxious    [] constricted    [] decreased range     [] depressed    [] dysthymic    [] euphoric    [] euthymic    [] irritable    [] labile     [] sad   [] Other:    Affect:      [x] congruent with mood      [x] full      [] constricted      [] guarded      [] flat      [] blunted      [] expansive   [] Other:    Thought Process /Attention /Concentration:      [x] Normal    []  blocked    [] circumstantial     [] concrete    [] flight of ideas     [x] goal directed    [] loose associations     [] tangential       [] distractable      [] inattentive       [x] associations intact [x]  abstract reasoning intact   Thought Content:     [] delusions    [] obsessions        [x]  absent of suicidal or homocidal ideation   [x] absent of psychotic symptoms  []  A/H     [] paranoia          [] obsessions      [] suicidal ideation      [] suicidal plan      [] suicidal  intent      [] passive suicidal ideation      [] homicidal ideation      [] homicidal plan      [] homicidal intent  [] Other:    Perception /Sensorium:         [x] alert     [] drowsy      [x] oriented to person    [x] oriented to place     [x] oriented to time     [x] oriented to situation          [x] does not appear to be reacting to internal or external stimuli today       [] appears to be reacting to internal or external stimuli  [] Other:    Language:  [x] age appropriate    []  naming okay   []  repetition    Fund of knowledge:  [x] age appropriate    [x]  adequate   [] in adequate []  above average   Cognition/Memory:        [x]  cognition grossly intact       []  cognition impaired due to:   []  immediate recall deficit  []  recent memory deficit  [] delayed memory deficit   [] MMSE:   [] MOCA:    Insight:        [x] age appropriate      [x] fair       [] good       [] limited   Judgment:        [x] age appropriate      [x] fair       [] good       [] limited        Assessment - Diagnosis - Goals:      Rhonda Pitts was seen today for medication management.    Diagnoses and all orders for this visit:    ADHD (attention deficit hyperactivity disorder), combined type    Other orders  -  atomoxetine (STRATTERA) 60 MG capsule; Take 1 capsule (60 mg total) by mouth daily                      Treatment Goals:                [] Restore  function and prevent recurrence        [] Strive to improve function and prevent recurrence or worsening of symptoms        [x] Maintain function and prevent recurrence      [] Other:         Plan:       Treatment Plan:  Cont straterra  , increase from 40 mg to 60 mg to seek improved benefit        [x]  Continue Medication Management      Associated Prescriptions and orders are noted above.        []  Therapy recommendation: continue   Declined  Strongly encouraged      []  Patient rejected psychotherapy recommendation.         Return in about 6 months (around 05/26/2019).        Review with patient:   Discussed risks and  benefits of treatment options as well as potential side effects and medication interactions. We discussed the potential benefits of medications, potential alternatives to medications, and the potential adverse outcomes with or without medications where applicable. Patient agreed to continue with safety plan which includes calling 911 and/or going to ER if needed. All questions answered and concerns addressed.     Audree Camel, MD    This note was completed using  Dragon Medical speech recognition software. Grammatical errors, random word insertions, pronoun errors and incomplete sentences are occasional consequences of this technology due to software limitations. If there are questions or concerns about the content of this note or information contained within the body of this dictation they should be addressed with the provider for clarification.

## 2019-03-23 ENCOUNTER — Other Ambulatory Visit: Payer: Self-pay

## 2019-03-23 DIAGNOSIS — Z20822 Contact with and (suspected) exposure to covid-19: Secondary | ICD-10-CM

## 2019-03-25 LAB — NOVEL CORONAVIRUS, NAA: SARS-CoV-2, NAA: NOT DETECTED

## 2019-05-03 ENCOUNTER — Other Ambulatory Visit: Payer: Self-pay | Admitting: *Deleted

## 2019-05-03 DIAGNOSIS — Z20822 Contact with and (suspected) exposure to covid-19: Secondary | ICD-10-CM

## 2019-05-04 LAB — NOVEL CORONAVIRUS, NAA: SARS-CoV-2, NAA: NOT DETECTED

## 2019-05-05 ENCOUNTER — Other Ambulatory Visit: Payer: Self-pay | Admitting: Family Medicine

## 2019-05-05 ENCOUNTER — Telehealth: Payer: PRIVATE HEALTH INSURANCE | Admitting: Family Medicine

## 2019-05-05 MED ORDER — PREDNISONE 20 MG PO TABS: ORAL_TABLET | ORAL | 0 refills | Status: AC

## 2019-05-12 IMAGING — CR DG TIBIA/FIBULA 2V*R*
1 series · 5 of 5 positions shown · non-contrast
Comparison: Right knee MRI dated 10/29/2016, right knee radiographs
dated 10/26/2016 and right tibia and fibula radiographs dated
09/05/2016.

CLINICAL DATA: Anterior mid right lower leg pain for the past week.
The patient plays softball.

EXAM:
RIGHT TIBIA AND FIBULA - 2 VIEW

[Series 1: dg tibia/fibula right · 0.14mm/px · 5 of 5 slices shown]
[im 1/5]
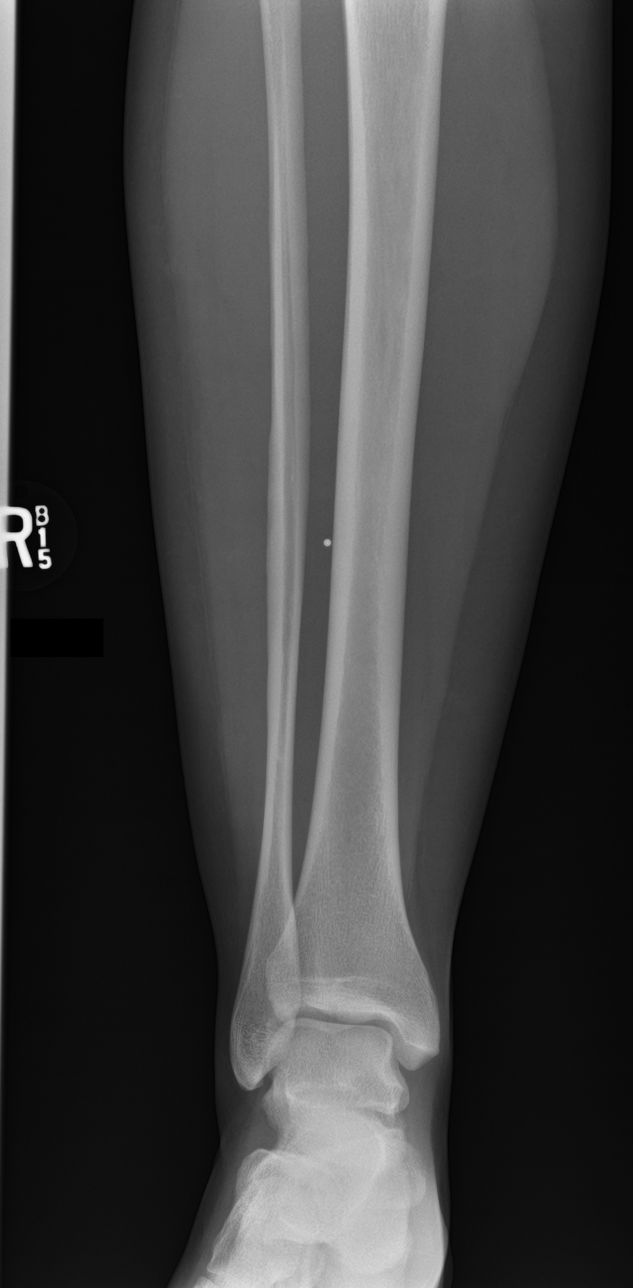
[im 2/5]
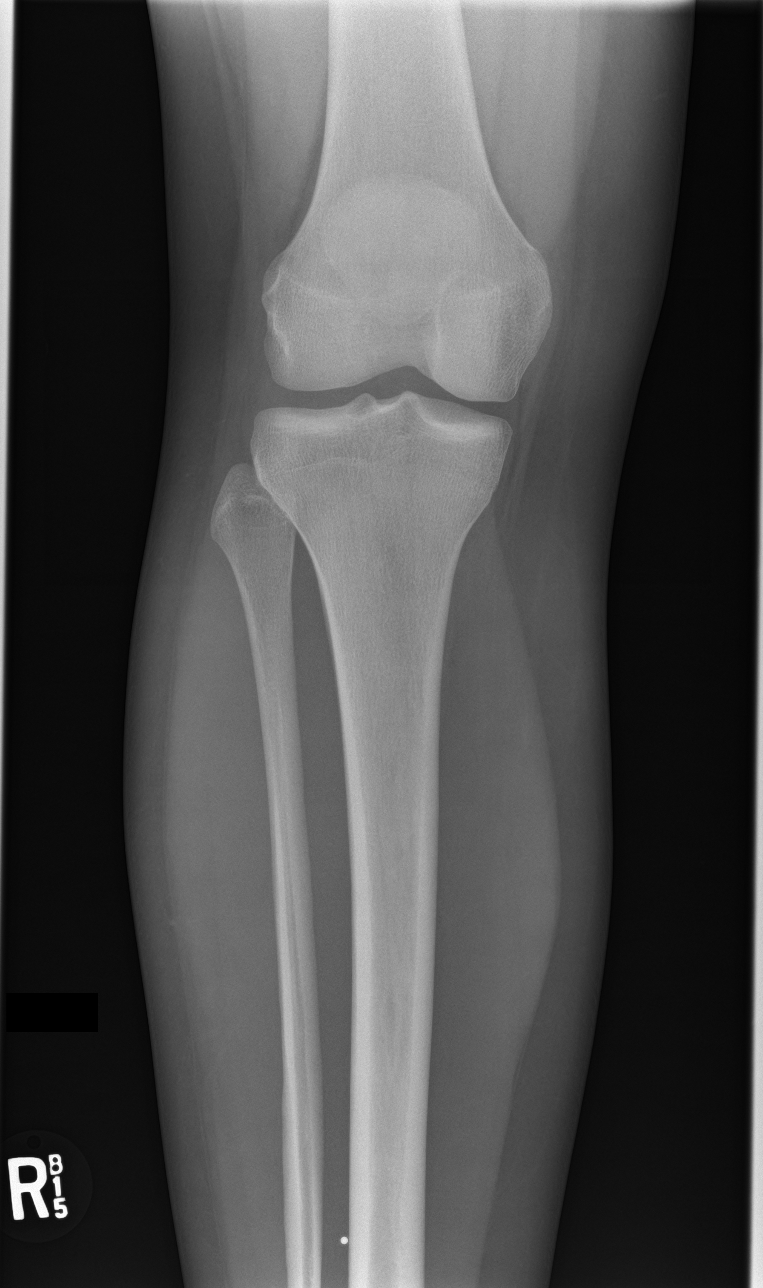
[im 3/5]
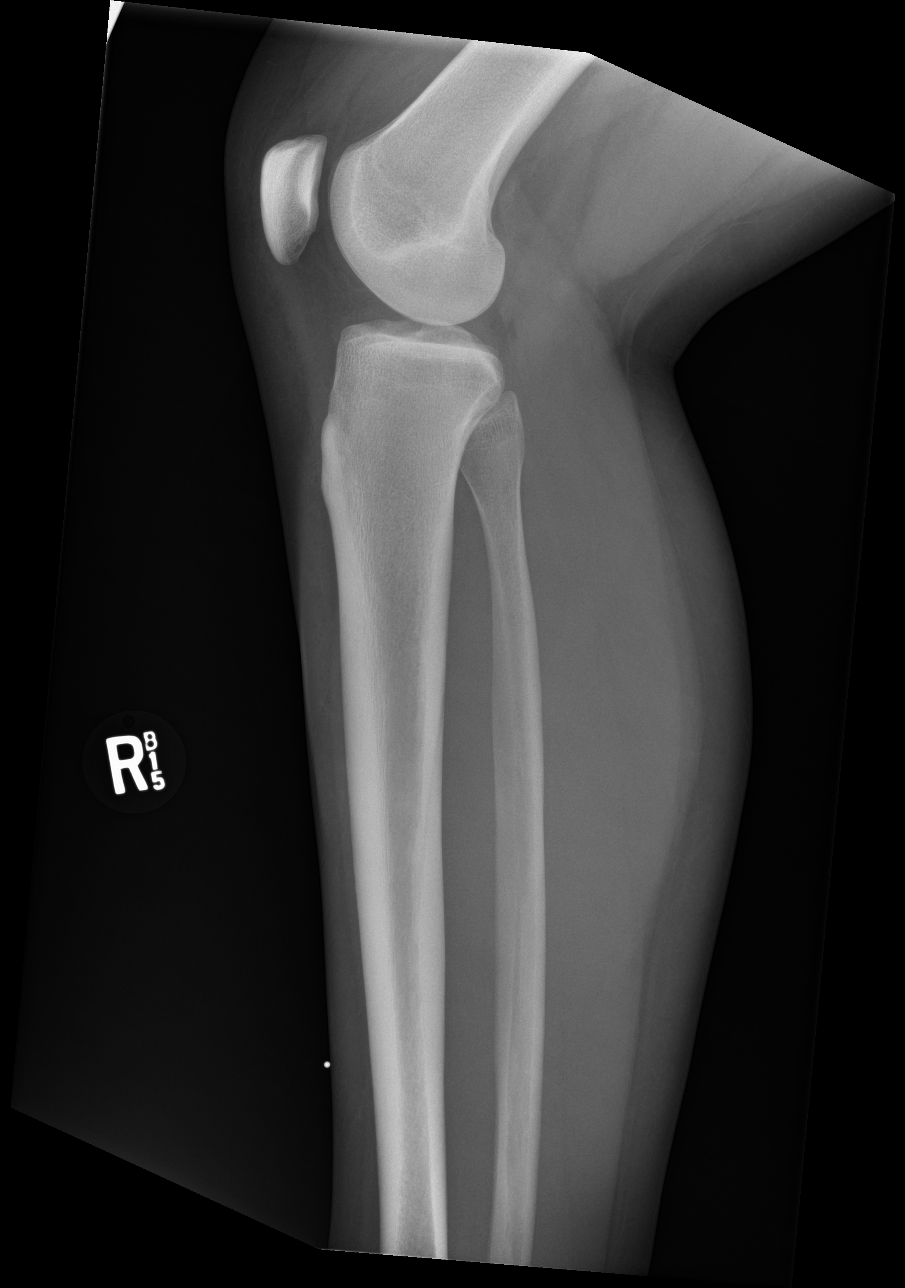
[im 4/5]
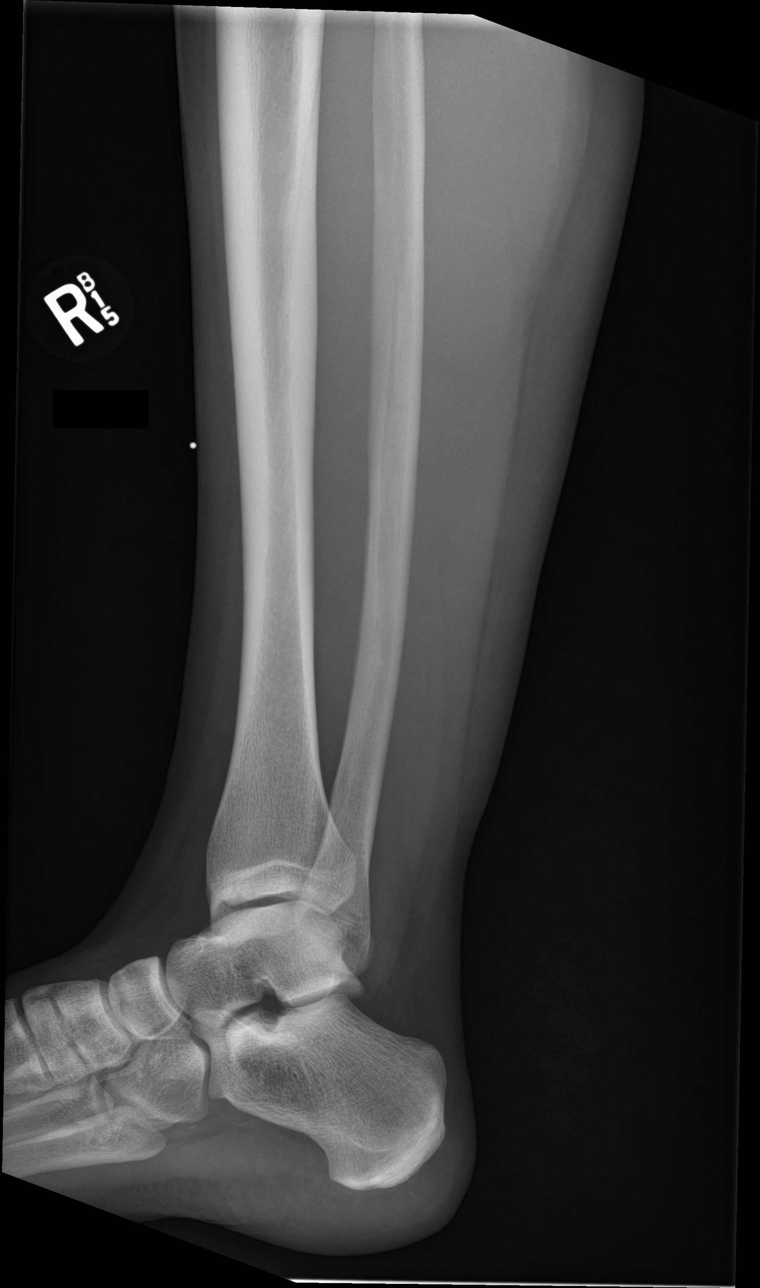
[im 5/5]
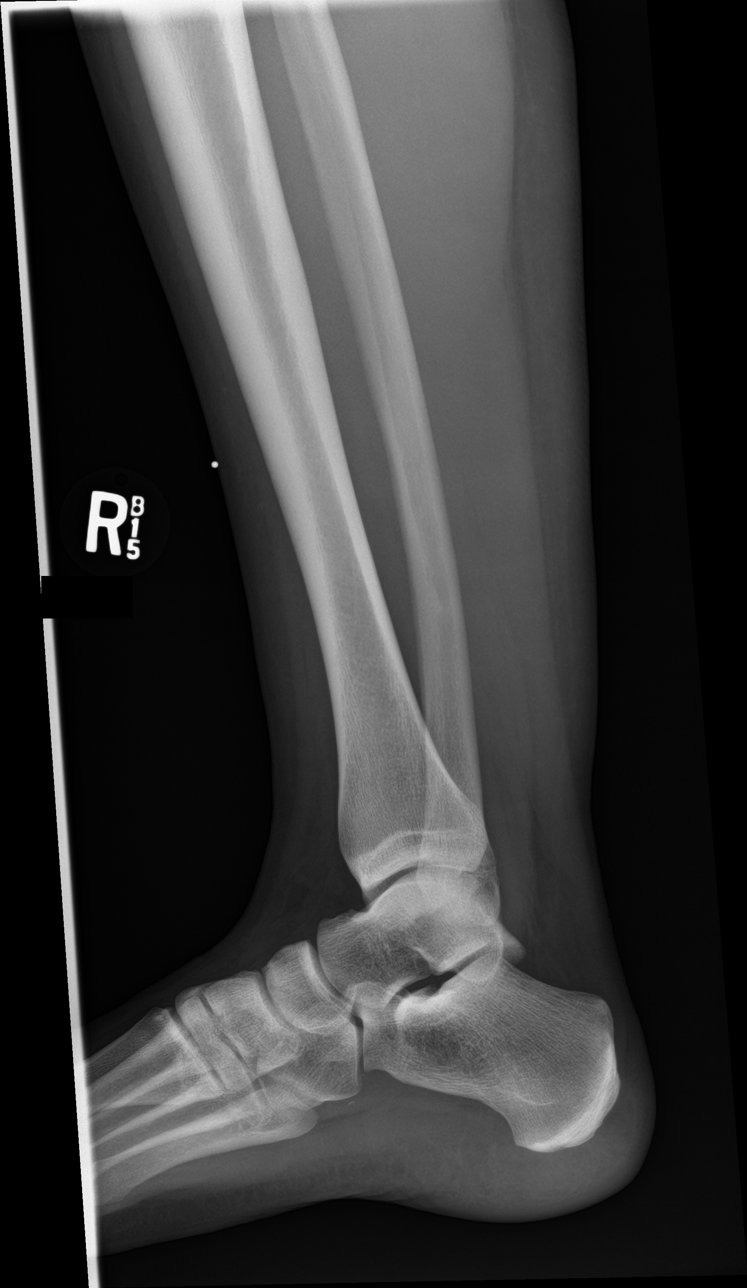

[5 of 5 positions shown; findings below may reference images not displayed]

FINDINGS: There is no evidence of fracture or other focal bone lesions. Soft
tissues are unremarkable.
IMPRESSION: Normal examination.

## 2019-05-24 ENCOUNTER — Ambulatory Visit: Payer: BC Managed Care – PPO | Attending: Psychiatry | Admitting: Psychiatry

## 2019-05-24 DIAGNOSIS — F99 Mental disorder, not otherwise specified: Secondary | ICD-10-CM | POA: Insufficient documentation

## 2019-05-24 DIAGNOSIS — F902 Attention-deficit hyperactivity disorder, combined type: Secondary | ICD-10-CM | POA: Insufficient documentation

## 2019-05-24 NOTE — Progress Notes (Signed)
[facility]  Follow up visit    Patient name:  Rhonda Pitts  MRN:  [id number]  DOB:  [birth year]    05/24/2019    Rhonda Pitts is a 23 y.o. female, presented for follow-up appointment.    Others Present in Session include: none    Chief Complaint:    Chief Complaint   Patient presents with    Medication Management   This visit was conducted with the use of interactive audio/video telecommunication that permitted real time communication between [facility] and Rhonda Camel, MD. She consented to participation and received services at home while I was located at [facility] at [facility].    Major Problem: ADHD  Increasing Strattera was quite helpful and she feels good about that.  Her studies are going well though certainly disrupted with COVID  UDS- appropriate 12/19  This problem is chronic and stable and some losses of focus  Issues continue in the context of straterra trial  Issues continue over   Associated Symptoms include no unusual behaviors, sig gains with straterra    Adverse events associated with medication include : none        Wt Readings from Last 3 Encounters:   07/27/18 82.6 kg (182 lb)   06/14/18 83.9 kg (185 lb)     Medications on File Prior to visit:    Current Outpatient Medications on File Prior to Visit   Medication Sig Dispense Refill    norgestimate-ethinyl estradiol (ORTHO-CYCLEN) 0.25-35 MG-MCG per tablet Take 1 tablet by mouth daily       No current facility-administered medications on file prior to visit.      No Known Allergies    Past medical history, family history and social history on file, reviewed.     Review Of Systems:     Neurological:  [x]  There is no recent history of h/a, weakness, dizziness or other abnormal neurological symptoms not otherwise discussed above.   []  Abnormality:    Constitutional:  [x]  There is no recent history of fever, malaise, or other abnormal constitutional symptoms not otherwise discussed above.   []   Abnormality:      Current Evaluation:     Mental Status Evaluation:  General/Appearance:  [x] age appropriate    [] bearded    [x] casually dressed       [] defiant     [x] cooperative [] disheveled    [] older than stated age    [] overweight    [] piercings    [] tattooed    [] thin & gaunt looking    [] well dressed    [] younger than stated age     [x] good eye contact    [] avoidant eye contact    [] hesitant   [] Other:    Gait:    [x] normal gait    [] gait abnormality:   Behavior/Psychomotor:    [x] Normal   [] psychomotor agitation    [] psychomotor retardation    [] restless     [] fidgety     [] tics  [] rigidity  [] flaccid       [] akathesia    [] choreaoathetoid movt   [] Other:    Speech:      [x] Normal pitch     [x] normal volume    [] articulation error    [] delayed    [] increased latency of response     [] loud    [] pressured    [] profane     [] soft [] perseveration  [] Other:    Mood:  [x]  Normal     [] angry    [] anxious    []   constricted    [] decreased range     [] depressed    [] dysthymic    [] euphoric    [] euthymic    [] irritable    [] labile     [] sad   [] Other:    Affect:      [x] congruent with mood      [x] full      [] constricted      [] guarded      [] flat      [] blunted      [] expansive   [] Other:    Thought Process /Attention /Concentration:      [x] Normal    []  blocked    [] circumstantial     [] concrete    [] flight of ideas     [x] goal directed    [] loose associations     [] tangential       [] distractable      [] inattentive       [x] associations intact [x]  abstract reasoning intact   Thought Content:     [] delusions    [] obsessions        [x]  absent of suicidal or homocidal ideation   [x] absent of psychotic symptoms  []  A/H     [] paranoia          [] obsessions      [] suicidal ideation      [] suicidal plan      [] suicidal intent      [] passive suicidal ideation      [] homicidal ideation      [] homicidal plan      [] homicidal intent  [] Other:    Perception /Sensorium:         [x] alert     [] drowsy      [x] oriented to person     [x] oriented to place     [x] oriented to time     [x] oriented to situation          [x] does not appear to be reacting to internal or external stimuli today       [] appears to be reacting to internal or external stimuli  [] Other:    Language:  [x] age appropriate    []  naming okay   []  repetition    Fund of knowledge:  [x] age appropriate    [x]  adequate   [] in adequate []  above average   Cognition/Memory:        [x]  cognition grossly intact       []  cognition impaired due to:   []  immediate recall deficit  []  recent memory deficit  [] delayed memory deficit   [] MMSE:   [] MOCA:    Insight:        [x] age appropriate      [x] fair       [] good       [] limited   Judgment:        [x] age appropriate      [x] fair       [] good       [] limited        Assessment - Diagnosis - Goals:      [name] was seen today for medication management.    Diagnoses and all orders for this visit:    ADHD (attention deficit hyperactivity disorder), combined type    Psychiatric problem                      Treatment Goals:                [] Restore  function and prevent recurrence        []   Strive to improve function and prevent recurrence or worsening of symptoms        [x] Maintain function and prevent recurrence      [] Other:         Plan:       Treatment Plan:  Cont straterra  60 mg to seek improved benefit        [x]  Continue Medication Management      Associated Prescriptions and orders are noted above.        []  Therapy recommendation: continue   Declined  Strongly encouraged      []  Patient rejected psychotherapy recommendation.         Return in about 6 months (around 11/21/2019).        Review with patient:   Discussed risks and benefits of treatment options as well as potential side effects and medication interactions. We discussed the potential benefits of medications, potential alternatives to medications, and the potential adverse outcomes with or without medications where applicable. Patient agreed to continue with safety plan which includes  calling 911 and/or going to ER if needed. All questions answered and concerns addressed.     Rhonda Camel, MD    This note was completed using  [employer]agon [employer] speech recognition software. Grammatical errors, random word insertions, pronoun errors and incomplete sentences are occasional consequences of this technology due to software limitations. If there are questions or concerns about the content of this note or information contained within the body of this dictation they should be addressed with the provider for clarification.

## 2019-09-19 ENCOUNTER — Ambulatory Visit: Payer: PRIVATE HEALTH INSURANCE | Attending: Internal Medicine

## 2019-09-19 DIAGNOSIS — Z23 Encounter for immunization: Secondary | ICD-10-CM

## 2019-09-19 NOTE — Progress Notes (Signed)
   Covid-19 Vaccination Clinic  Name:  Jaime Steele    MRN: [id number] DOB: Mar 01, 1996  09/19/2019  [name] was observed post Covid-19 immunization for 15 minutes without incident. She was provided with Vaccine Information Sheet and instruction to access the V-Safe system.   [name] was instructed to call 911 with any severe reactions post vaccine: Marland Kitchen Difficulty breathing  . Swelling of face and throat  . A fast heartbeat  . A bad rash all over body  . Dizziness and weakness   Immunizations Administered    Name Date Dose VIS Date Route   Pfizer COVID-19 Vaccine 09/19/2019  4:34 PM 0.3 mL 06/10/2019 Intramuscular   Manufacturer: [employer], [employer]   Lot: [id number]   NDC: [address]-7

## 2019-09-23 ENCOUNTER — Other Ambulatory Visit (INDEPENDENT_AMBULATORY_CARE_PROVIDER_SITE_OTHER): Payer: Self-pay | Admitting: Psychiatry

## 2019-10-10 ENCOUNTER — Ambulatory Visit: Payer: PRIVATE HEALTH INSURANCE | Attending: Internal Medicine

## 2019-10-10 DIAGNOSIS — Z23 Encounter for immunization: Secondary | ICD-10-CM

## 2019-10-10 NOTE — Progress Notes (Signed)
   Covid-19 Vaccination Clinic  Name:  Jaime Steele    MRN: [id number] DOB: [birth year]  10/10/2019  [name] was observed post Covid-19 immunization for 15 minutes without incident. She was provided with Vaccine Information Sheet and instruction to access the V-Safe system.   [name] was instructed to call 911 with any severe reactions post vaccine: Marland Kitchen Difficulty breathing  . Swelling of face and throat  . A fast heartbeat  . A bad rash all over body  . Dizziness and weakness   Immunizations Administered    Name Date Dose VIS Date Route   Pfizer COVID-19 Vaccine 10/10/2019  3:55 PM 0.3 mL 06/10/2019 Intramuscular   Manufacturer: [employer], [employer]   Lot: [id number]   NDC: [address]-7

## 2019-11-08 ENCOUNTER — Encounter (INDEPENDENT_AMBULATORY_CARE_PROVIDER_SITE_OTHER): Payer: BC Managed Care – PPO | Admitting: Psychiatry

## 2019-12-07 ENCOUNTER — Ambulatory Visit: Payer: BC Managed Care – PPO | Attending: Psychiatry | Admitting: Psychiatry

## 2019-12-07 DIAGNOSIS — F99 Mental disorder, not otherwise specified: Secondary | ICD-10-CM | POA: Insufficient documentation

## 2019-12-07 DIAGNOSIS — F902 Attention-deficit hyperactivity disorder, combined type: Secondary | ICD-10-CM | POA: Insufficient documentation

## 2019-12-07 MED ORDER — ATOMOXETINE HCL 60 MG PO CAPS
60.00 mg | ORAL_CAPSULE | Freq: Every day | ORAL | 1 refills | Status: DC
Start: 2019-12-07 — End: 2020-02-08

## 2019-12-07 NOTE — Progress Notes (Signed)
[facility]  Follow up visit    Patient name:  Rhonda Pitts  MRN:  [id number]  DOB:  03/03/1996    12/07/2019    Rhonda Pitts is a 24 y.o. female, presented for follow-up appointment.    Others Present in Session include: none    Chief Complaint:    Chief Complaint   Patient presents with    Medication Management   This visit was conducted with the use of interactive audio/video telecommunication that permitted real time communication between [facility] and Audree Camel, MD. She consented to participation and received services at home while I was located at [facility] at [facility].    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  UDS- appropriate 12/19  This problem is chronic and stable and some losses of focus  Issues continue in the context of straterra   Issues continue over   Associated Symptoms include no unusual behaviors, sig gains with straterra  Adverse events associated with medication include : none      Wt Readings from Last 3 Encounters:   07/27/18 82.6 kg (182 lb)   06/14/18 83.9 kg (185 lb)     Medications on File Prior to visit:    Current Outpatient Medications on File Prior to Visit   Medication Sig Dispense Refill    norgestimate-ethinyl estradiol (ORTHO-CYCLEN) 0.25-35 MG-MCG per tablet Take 1 tablet by mouth daily      [DISCONTINUED] atomoxetine (STRATTERA) 40 MG capsule Take 1 capsule by mouth once daily 30 capsule 1    [DISCONTINUED] atomoxetine (STRATTERA) 60 MG capsule Take 1 capsule by mouth once daily 30 capsule 0     No current facility-administered medications on file prior to visit.     No Known Allergies    Past medical history, family history and social history on file, reviewed.     Review Of Systems:     Neurological:  [x]  There is no recent history of h/a, weakness, dizziness or other abnormal neurological symptoms not otherwise discussed above.   []   Abnormality:    Constitutional:  [x]  There is no recent history of fever, malaise, or other abnormal constitutional symptoms not otherwise discussed above.   []  Abnormality:      Current Evaluation:     Mental Status Evaluation:  General/Appearance:  [x] age appropriate    [] bearded    [x] casually dressed       [] defiant     [x] cooperative [] disheveled    [] older than stated age    [] overweight    [] piercings    [] tattooed    [] thin & gaunt looking    [] well dressed    [] younger than stated age     [x] good eye contact    [] avoidant eye contact    [] hesitant   [] Other:    Gait:    [x] normal gait    [] gait abnormality:   Behavior/Psychomotor:    [x] Normal   [] psychomotor agitation    [] psychomotor retardation    [] restless     [] fidgety     [] tics  [] rigidity  [] flaccid       [] akathesia    [] choreaoathetoid movt   [] Other:    Speech:      [x] Normal pitch     [x] normal volume    [] articulation error    [] delayed    [] increased latency of response     [] loud    [] pressured    []   profane     [] soft [] perseveration  [] Other:    Mood:  [x]  Normal     [] angry    [] anxious    [] constricted    [] decreased range     [] depressed    [] dysthymic    [] euphoric    [] euthymic    [] irritable    [] labile     [] sad   [] Other:    Affect:      [x] congruent with mood      [x] full      [] constricted      [] guarded      [] flat      [] blunted      [] expansive   [] Other:    Thought Process /Attention /Concentration:      [x] Normal    []  blocked    [] circumstantial     [] concrete    [] flight of ideas     [x] goal directed    [] loose associations     [] tangential       [] distractable      [] inattentive       [x] associations intact [x]  abstract reasoning intact   Thought Content:     [] delusions    [] obsessions        [x]  absent of suicidal or homocidal ideation   [x] absent of psychotic symptoms  []  A/H     [] paranoia          [] obsessions      [] suicidal ideation      [] suicidal plan      [] suicidal intent      [] passive suicidal ideation       [] homicidal ideation      [] homicidal plan      [] homicidal intent  [] Other:    Perception /Sensorium:         [x] alert     [] drowsy      [x] oriented to person    [x] oriented to place     [x] oriented to time     [x] oriented to situation          [x] does not appear to be reacting to internal or external stimuli today       [] appears to be reacting to internal or external stimuli  [] Other:    Language:  [x] age appropriate    []  naming okay   []  repetition    Fund of knowledge:  [x] age appropriate    [x]  adequate   [] in adequate []  above average   Cognition/Memory:        [x]  cognition grossly intact       []  cognition impaired due to:   []  immediate recall deficit  []  recent memory deficit  [] delayed memory deficit   [] MMSE:   [] MOCA:    Insight:        [x] age appropriate      [x] fair       [] good       [] limited   Judgment:        [x] age appropriate      [x] fair       [] good       [] limited        Assessment - Diagnosis - Goals:      Crystallee was seen today for medication management.    Diagnoses and all orders for this visit:    ADHD (attention deficit hyperactivity disorder), combined type    Psychiatric problem    Other orders  -     atomoxetine (STRATTERA) 60 MG capsule; Take 1 capsule (60 mg  total) by mouth daily                      Treatment Goals:                [] Restore  function and prevent recurrence        [] Strive to improve function and prevent recurrence or worsening of symptoms        [x] Maintain function and prevent recurrence      [] Other:         Plan:     Treatment Plan:  Cont straterra  60 mg to seek improved benefit      [x]  Continue Medication Management      Associated Prescriptions and orders are noted above.        []  Therapy recommendation: continue   Declined  Strongly encouraged      []  Patient rejected psychotherapy recommendation.         Return in about 6 months (around 06/07/2020).        Review with patient:   Discussed risks and benefits of treatment options as well as potential side  effects and medication interactions. We discussed the potential benefits of medications, potential alternatives to medications, and the potential adverse outcomes with or without medications where applicable. Patient agreed to continue with safety plan which includes calling 911 and/or going to ER if needed. All questions answered and concerns addressed.     Audree Camel, MD    This note was completed using  [employer] speech recognition software. Grammatical errors, random word insertions, pronoun errors and incomplete sentences are occasional consequences of this technology due to software limitations. If there are questions or concerns about the content of this note or information contained within the body of this dictation they should be addressed with the provider for clarification.

## 2020-02-07 ENCOUNTER — Other Ambulatory Visit (INDEPENDENT_AMBULATORY_CARE_PROVIDER_SITE_OTHER): Payer: Self-pay | Admitting: Psychiatry

## 2020-05-30 ENCOUNTER — Ambulatory Visit: Payer: BC Managed Care – PPO | Attending: Psychiatry | Admitting: Psychiatry

## 2020-05-30 DIAGNOSIS — F902 Attention-deficit hyperactivity disorder, combined type: Secondary | ICD-10-CM | POA: Insufficient documentation

## 2020-05-30 MED ORDER — ATOMOXETINE HCL 60 MG PO CAPS
60.0000 mg | ORAL_CAPSULE | Freq: Every day | ORAL | 1 refills | Status: DC
Start: 2020-05-30 — End: 2020-09-26

## 2020-05-30 NOTE — Progress Notes (Signed)
[facility]  Follow up visit    Patient name:  Rhonda Pitts  MRN:  [id number]  DOB:  08/13/1995    05/30/2020    Rhonda Pitts is a 24 y.o. female, presented for follow-up appointment.    Others Present in Session include: none    Chief Complaint:    Chief Complaint   Patient presents with    Medication Management   This visit was conducted with the use of interactive audio/video telecommunication that permitted real time communication between [facility] and Audree Camel, MD. She consented to participation and received services at [facility] while I was located at [facility] at [facility].    Major Problem: ADHD  Rhonda Pitts going well,  Rhonda Pitts is going fine, hopes to get playing time  UDS- appropriate 12/19  This problem is chronic and stable and some losses of focus  Issues continue in the context of straterra   Issues continue over   Associated Symptoms include no unusual behaviors, sig gains with straterra  Adverse events associated with medication include : none      Wt Readings from Last 3 Encounters:   07/27/18 82.6 kg (182 lb)   06/14/18 83.9 kg (185 lb)     Medications on File Prior to visit:    Current Outpatient Medications on File Prior to Visit   Medication Sig Dispense Refill    [DISCONTINUED] atomoxetine (STRATTERA) 60 MG capsule Take 1 capsule by mouth once daily 30 capsule 0    norgestimate-ethinyl estradiol (ORTHO-CYCLEN) 0.25-35 MG-MCG per tablet Take 1 tablet by mouth daily       No current facility-administered medications on file prior to visit.     No Known Allergies    Past medical history, family history and social history on file, reviewed.     Review Of Systems:     Neurological:  [x]  There is no recent history of h/a, weakness, dizziness or other abnormal neurological symptoms not otherwise discussed above.   []  Abnormality:    Constitutional:  [x]  There is no recent history of fever, malaise, or other abnormal constitutional symptoms not otherwise discussed  above.   []  Abnormality:      Current Evaluation:     Mental Status Evaluation:  General/Appearance:  [x] age appropriate    [] bearded    [x] casually dressed       [] defiant     [x] cooperative [] disheveled    [] older than stated age    [] overweight    [] piercings    [] tattooed    [] thin & gaunt looking    [] well dressed    [] younger than stated age     [x] good eye contact    [] avoidant eye contact    [] hesitant   [] Other:    Gait:    [x] normal gait    [] gait abnormality:   Behavior/Psychomotor:    [x] Normal   [] psychomotor agitation    [] psychomotor retardation    [] restless     [] fidgety     [] tics  [] rigidity  [] flaccid       [] akathesia    [] choreaoathetoid movt   [] Other:    Speech:      [x] Normal pitch     [x] normal volume    [] articulation error    [] delayed    [] increased latency of response     [] loud    [] pressured    [] profane     [] soft [] perseveration  [] Other:    Mood:  [x]  Normal     []   angry    [] anxious    [] constricted    [] decreased range     [] depressed    [] dysthymic    [] euphoric    [] euthymic    [] irritable    [] labile     [] sad   [] Other:    Affect:      [x] congruent with mood      [x] full      [] constricted      [] guarded      [] flat      [] blunted      [] expansive   [] Other:    Thought Process /Attention /Concentration:      [x] Normal    []  blocked    [] circumstantial     [] concrete    [] flight of ideas     [x] goal directed    [] loose associations     [] tangential       [] distractable      [] inattentive       [x] associations intact [x]  abstract reasoning intact   Thought Content:     [] delusions    [] obsessions        [x]  absent of suicidal or homocidal ideation   [x] absent of psychotic symptoms  []  A/H     [] paranoia          [] obsessions      [] suicidal ideation      [] suicidal plan      [] suicidal intent      [] passive suicidal ideation      [] homicidal ideation      [] homicidal plan      [] homicidal intent  [] Other:    Perception /Sensorium:         [x] alert     [] drowsy      [x] oriented  to person    [x] oriented to place     [x] oriented to time     [x] oriented to situation          [x] does not appear to be reacting to internal or external stimuli today       [] appears to be reacting to internal or external stimuli  [] Other:    Language:  [x] age appropriate    []  naming okay   []  repetition    Fund of knowledge:  [x] age appropriate    [x]  adequate   [] in adequate []  above average   Cognition/Memory:        [x]  cognition grossly intact       []  cognition impaired due to:   []  immediate recall deficit  []  recent memory deficit  [] delayed memory deficit   [] MMSE:   [] MOCA:    Insight:        [x] age appropriate      [x] fair       [] good       [] limited   Judgment:        [x] age appropriate      [x] fair       [] good       [] limited        Assessment  Diagnosis  Goals:      Rhonda Pitts was seen today for medication management.    Diagnoses and all orders for this visit:    ADHD (attention deficit hyperactivity disorder), combined type    Other orders  -     atomoxetine (STRATTERA) 60 MG capsule; Take 1 capsule (60 mg total) by mouth daily  Treatment Goals:                [] Restore  function and prevent recurrence        [] Strive to improve function and prevent recurrence or worsening of symptoms        [x] Maintain function and prevent recurrence      [] Other:         Plan:     Treatment Plan:  Cont straterra  60 mg to seek improved benefit  She has recently discontinued her birth control we discussed using contraception and sexually involved in the potential risks involved with Strattera for pregnancy.      [x]  Continue Medication Management      Associated Prescriptions and orders are noted above.        []  Therapy recommendation: continue   Declined  Strongly encouraged      []  Patient rejected psychotherapy recommendation.         Return in about 6 months (around 11/28/2020).        Review with patient:   Discussed risks and benefits of treatment options as well as potential side effects  and medication interactions. We discussed the potential benefits of medications, potential alternatives to medications, and the potential adverse outcomes with or without medications where applicable. Patient agreed to continue with safety plan which includes calling 911 and/or going to ER if needed. All questions answered and concerns addressed.     Audree Camel, MD    This note was completed using  [employer] speech recognition software. Grammatical errors, random word insertions, pronoun errors and incomplete sentences are occasional consequences of this technology due to software limitations. If there are questions or concerns about the content of this note or information contained within the body of this dictation they should be addressed with the provider for clarification.

## 2020-09-25 ENCOUNTER — Other Ambulatory Visit (INDEPENDENT_AMBULATORY_CARE_PROVIDER_SITE_OTHER): Payer: Self-pay | Admitting: Psychiatry

## 2020-12-12 ENCOUNTER — Ambulatory Visit: Payer: Self-pay | Attending: Psychiatry | Admitting: Psychiatry

## 2020-12-12 DIAGNOSIS — F902 Attention-deficit hyperactivity disorder, combined type: Secondary | ICD-10-CM | POA: Insufficient documentation

## 2020-12-12 MED ORDER — ATOMOXETINE HCL 60 MG PO CAPS
60.0000 mg | ORAL_CAPSULE | Freq: Every day | ORAL | 5 refills | Status: DC
Start: 2020-12-12 — End: 2021-05-22

## 2020-12-12 NOTE — Progress Notes (Signed)
[facility]  Follow up visit    Patient name:  Rhonda Pitts  MRN:  [id number]  DOB:  08-Nov-1995    12/12/2020    Koda Defrank is a 25 y.o. female, presented for follow-up appointment.    Others Present in Session include: none    Chief Complaint:    Chief Complaint   Patient presents with   . Medication Management     This visit was conducted with the use of interactive audio/video telecommunication that permitted real time communication between [facility] and Audree Camel, MD. She consented to participation and received services at [facility] while I was located at [facility] at [facility].    Major Problem: ADHD  Blase Mess going well,  [employer] is going fine, hopes to get playing time  Season went well, she performed well  UDS- appropriate 12/19  This problem is chronic and stable and some losses of focus  Issues continue in the context of straterra   Issues continue over 6 months  Associated Symptoms include no unusual behaviors, sig gains with straterra  Adverse events associated with medication include : none      Wt Readings from Last 3 Encounters:   07/27/18 82.6 kg (182 lb)   06/14/18 83.9 kg (185 lb)     Medications on File Prior to visit:    Current Outpatient Medications on File Prior to Visit   Medication Sig Dispense Refill   . norgestimate-ethinyl estradiol (ORTHO-CYCLEN) 0.25-35 MG-MCG per tablet Take 1 tablet by mouth daily     . [DISCONTINUED] atomoxetine (STRATTERA) 60 MG capsule Take 1 capsule by mouth once daily 30 capsule 2     No current facility-administered medications on file prior to visit.     No Known Allergies    Past medical history, family history and social history on file, reviewed.     Review Of Systems:     Neurological:  [x]  There is no recent history of h/a, weakness, dizziness or other abnormal neurological symptoms not otherwise discussed above.   []  Abnormality:    Constitutional:  [x]  There is no recent history of fever, malaise, or other abnormal  constitutional symptoms not otherwise discussed above.   []  Abnormality:      Current Evaluation:     Mental Status Evaluation:  General/Appearance:  [x] age appropriate    [] bearded    [x] casually dressed       [] defiant     [x] cooperative [] disheveled    [] older than stated age    [] overweight    [] piercings    [] tattooed    [] thin & gaunt looking    [] well dressed    [] younger than stated age     [x] good eye contact    [] avoidant eye contact    [] hesitant   [] Other:    Gait:    [x] normal gait    [] gait abnormality:   Behavior/Psychomotor:    [x] Normal   [] psychomotor agitation    [] psychomotor retardation    [] restless     [] fidgety     [] tics  [] rigidity  [] flaccid       [] akathesia    [] choreaoathetoid movt   [] Other:    Speech:      [x] Normal pitch     [x] normal volume    [] articulation error    [] delayed    [] increased latency of response     [] loud    [] pressured    [] profane     [] soft [] perseveration  [] Other:  Mood:  [x]  Normal     [] angry    [] anxious    [] constricted    [] decreased range     [] depressed    [] dysthymic    [] euphoric    [] euthymic    [] irritable    [] labile     [] sad   [] Other:    Affect:      [x] congruent with mood      [x] full      [] constricted      [] guarded      [] flat      [] blunted      [] expansive   [] Other:    Thought Process /Attention /Concentration:      [x] Normal    []  blocked    [] circumstantial     [] concrete    [] flight of ideas     [x] goal directed    [] loose associations     [] tangential       [] distractable      [] inattentive       [x] associations intact [x]  abstract reasoning intact   Thought Content:     [] delusions    [] obsessions        [x]  absent of suicidal or homocidal ideation   [x] absent of psychotic symptoms  []  A/H     [] paranoia          [] obsessions      [] suicidal ideation      [] suicidal plan      [] suicidal intent      [] passive suicidal ideation      [] homicidal ideation      [] homicidal plan      [] homicidal intent  [] Other:    Perception /Sensorium:          [x] alert     [] drowsy      [x] oriented to person    [x] oriented to place     [x] oriented to time     [x] oriented to situation          [x] does not appear to be reacting to internal or external stimuli today       [] appears to be reacting to internal or external stimuli  [] Other:    Language:  [x] age appropriate    []  naming okay   []  repetition    Fund of knowledge:  [x] age appropriate    [x]  adequate   [] in adequate []  above average   Cognition/Memory:        [x]  cognition grossly intact       []  cognition impaired due to:   []  immediate recall deficit  []  recent memory deficit  [] delayed memory deficit   [] MMSE:   [] MOCA:    Insight:        [x] age appropriate      [x] fair       [] good       [] limited   Judgment:        [x] age appropriate      [x] fair       [] good       [] limited        Assessment - Diagnosis - Goals:      Rhonda Pitts was seen today for medication management.    Diagnoses and all orders for this visit:    ADHD (attention deficit hyperactivity disorder), combined type    Other orders  -     atomoxetine (STRATTERA) 60 MG capsule; Take 1 capsule (60 mg total) by mouth daily  Treatment Goals:                [] Restore  function and prevent recurrence        [] Strive to improve function and prevent recurrence or worsening of symptoms        [x] Maintain function and prevent recurrence      [] Other:       Plan:     Treatment Plan:  Cont straterra  60 mg           [x]  Continue Medication Management      Associated Prescriptions and orders are noted above.        []  Therapy recommendation: continue   Declined  Strongly encouraged      []  Patient rejected psychotherapy recommendation.         Return in about 6 months (around 06/13/2021).        Review with patient:   Discussed risks and benefits of treatment options as well as potential side effects and medication interactions. We discussed the potential benefits of medications, potential alternatives to medications, and the potential adverse  outcomes with or without medications where applicable. Patient agreed to continue with safety plan which includes calling 911 and/or going to ER if needed. All questions answered and concerns addressed.     Audree Camel, MD    This note was completed using  [employer] speech recognition software. Grammatical errors, random word insertions, pronoun errors and incomplete sentences are occasional consequences of this technology due to software limitations. If there are questions or concerns about the content of this note or information contained within the body of this dictation they should be addressed with the provider for clarification.

## 2020-12-19 ENCOUNTER — Encounter (INDEPENDENT_AMBULATORY_CARE_PROVIDER_SITE_OTHER): Payer: Self-pay | Admitting: Psychiatry

## 2021-05-22 ENCOUNTER — Encounter (INDEPENDENT_AMBULATORY_CARE_PROVIDER_SITE_OTHER): Payer: Self-pay | Admitting: Psychiatry

## 2021-05-22 ENCOUNTER — Ambulatory Visit: Payer: BC Managed Care – PPO | Attending: Psychiatry | Admitting: Psychiatry

## 2021-05-22 VITALS — BP 116/58 | Wt 165.0 lb

## 2021-05-22 DIAGNOSIS — F902 Attention-deficit hyperactivity disorder, combined type: Secondary | ICD-10-CM | POA: Insufficient documentation

## 2021-05-22 DIAGNOSIS — Z79899 Other long term (current) drug therapy: Secondary | ICD-10-CM | POA: Insufficient documentation

## 2021-05-22 MED ORDER — ATOMOXETINE HCL 60 MG PO CAPS
60.0000 mg | ORAL_CAPSULE | Freq: Every day | ORAL | 11 refills | Status: AC
Start: 2021-05-22 — End: ?

## 2021-05-22 NOTE — Progress Notes (Signed)
[facility]  Follow up visit    Patient name:  Rhonda Pitts  MRN:  [id number]  DOB:  Nov 27, 1995    05/22/2021    Rhonda Pitts is a 25 y.o. female, presented for follow-up appointment.    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  UDS- appropriate 12/19  This problem is chronic and stable and some losses of focus  Issues continue in the context of straterra   Issues continue over 6 months  Associated Symptoms include no unusual behaviors, sig gains with straterra  Adverse events associated with medication include : none      Wt Readings from Last 3 Encounters:   05/22/21 74.8 kg (165 lb)   07/27/18 82.6 kg (182 lb)   06/14/18 83.9 kg (185 lb)     Medications on File Prior to visit:    Current Outpatient Medications on File Prior to Visit   Medication Sig Dispense Refill    norgestimate-ethinyl estradiol (ORTHO-CYCLEN) 0.25-35 MG-MCG per tablet Take 1 tablet by mouth daily      [DISCONTINUED] atomoxetine (STRATTERA) 60 MG capsule Take 1 capsule (60 mg total) by mouth daily 30 capsule 5     No current facility-administered medications on file prior to visit.     No Known Allergies    Past medical history, family history and social history on file, reviewed.     Review Of Systems:     Neurological:  [x]  There is no recent history of h/a, weakness, dizziness or other abnormal neurological symptoms not otherwise discussed above.   []  Abnormality:    Constitutional:  [x]  There is no recent history of fever, malaise, or other abnormal constitutional symptoms not otherwise discussed above.   []  Abnormality:      Current Evaluation:     Mental Status Evaluation:  General/Appearance:  [x] age appropriate    [] bearded    [x] casually dressed       [] defiant     [x] cooperative [] disheveled    [] older than stated age     [] overweight    [] piercings    [] tattooed    [] thin & gaunt looking    [] well dressed    [] younger than stated age     [x] good eye contact    [] avoidant eye contact    [] hesitant   [] Other:    Gait:    [x] normal gait    [] gait abnormality:   Behavior/Psychomotor:    [x] Normal   [] psychomotor agitation    [] psychomotor retardation    [] restless     [] fidgety     [] tics  [] rigidity  [] flaccid       [] akathesia    [] choreaoathetoid movt   [] Other:    Speech:      [x] Normal pitch     [x] normal volume    [] articulation error    [] delayed    [] increased latency of response     [] loud    [] pressured    [] profane     [] soft [] perseveration  [] Other:    Mood:  [x]  Normal     [] angry    [] anxious    [] constricted    [] decreased range     [] depressed    [] dysthymic    [] euphoric    []   euthymic    [] irritable    [] labile     [] sad   [] Other:    Affect:      [x] congruent with mood      [x] full      [] constricted      [] guarded      [] flat      [] blunted      [] expansive   [] Other:    Thought Process /Attention /Concentration:      [x] Normal    []  blocked    [] circumstantial     [] concrete    [] flight of ideas     [x] goal directed    [] loose associations     [] tangential       [] distractable      [] inattentive       [x] associations intact [x]  abstract reasoning intact   Thought Content:     [] delusions    [] obsessions        [x]  absent of suicidal or homocidal ideation   [x] absent of psychotic symptoms  []  A/H     [] paranoia          [] obsessions      [] suicidal ideation      [] suicidal plan      [] suicidal intent      [] passive suicidal ideation      [] homicidal ideation      [] homicidal plan      [] homicidal intent  [] Other:    Perception /Sensorium:         [x] alert     [] drowsy      [x] oriented to person    [x] oriented to place     [x] oriented to time     [x] oriented to situation          [x] does not appear to be reacting to internal or external stimuli today       [] appears to be reacting to internal or external stimuli   [] Other:    Language:  [x] age appropriate    []  naming okay   []  repetition    Fund of knowledge:  [x] age appropriate    [x]  adequate   [] in adequate []  above average   Cognition/Memory:        [x]  cognition grossly intact       []  cognition impaired due to:   []  immediate recall deficit  []  recent memory deficit  [] delayed memory deficit   [] MMSE:   [] MOCA:    Insight:        [x] age appropriate      [x] fair       [] good       [] limited   Judgment:        [x] age appropriate      [x] fair       [] good       [] limited        Assessment - Diagnosis - Goals:      Rhonda Pitts was seen today for medication management.    Diagnoses and all orders for this visit:    ADHD (attention deficit hyperactivity disorder), combined type    Other orders  -     atomoxetine (STRATTERA) 60 MG capsule; Take 1 capsule (60 mg) by mouth daily                  Treatment Goals:                [] Restore  function and prevent recurrence        [] Strive to improve function and  prevent recurrence or worsening of symptoms        [x] Maintain function and prevent recurrence      [] Other:       Plan:     Treatment Plan:  Cont straterra  60 mg   OCP and review risks        [x]  Continue Medication Management      Associated Prescriptions and orders are noted above.        []  Therapy recommendation: continue   Declined  Strongly encouraged      []  Patient rejected psychotherapy recommendation.         Return in about 1 year (around 05/22/2022).        Review with patient:   Discussed risks and benefits of treatment options as well as potential side effects and medication interactions. We discussed the potential benefits of medications, potential alternatives to medications, and the potential adverse outcomes with or without medications where applicable. Patient agreed to continue with safety plan which includes calling 911 and/or going to ER if needed. All questions answered and concerns addressed.     Audree Camel, MD    This note was completed using   [employer] speech recognition software. Grammatical errors, random word insertions, pronoun errors and incomplete sentences are occasional consequences of this technology due to software limitations. If there are questions or concerns about the content of this note or information contained within the body of this dictation they should be addressed with the provider for clarification.

## 2022-05-20 ENCOUNTER — Encounter (INDEPENDENT_AMBULATORY_CARE_PROVIDER_SITE_OTHER): Payer: Self-pay | Admitting: Psychiatry

## 2023-05-19 ENCOUNTER — Ambulatory Visit
Admission: EM | Admit: 2023-05-19 | Discharge: 2023-05-19 | Disposition: A | Discharge status: Home / Self Care | Payer: No Typology Code available for payment source | Attending: Emergency Medicine | Admitting: Emergency Medicine

## 2023-05-19 DIAGNOSIS — R109 Unspecified abdominal pain: Secondary | ICD-10-CM | POA: Diagnosis present

## 2023-05-19 LAB — POCT URINALYSIS DIP (MANUAL ENTRY)
Bilirubin, UA: NEGATIVE
Glucose, UA: NEGATIVE mg/dL
Ketones, POC UA: NEGATIVE mg/dL
Nitrite, UA: NEGATIVE
Protein Ur, POC: 30 mg/dL — AB
Spec Grav, UA: 1.01 (ref 1.010–1.025)
Urobilinogen, UA: 0.2 U/dL
pH, UA: 7 (ref 5.0–8.0)

## 2023-05-19 LAB — POCT URINE PREGNANCY: Preg Test, Ur: NEGATIVE

## 2023-05-19 MED ORDER — TAMSULOSIN HCL 0.4 MG PO CAPS: 0.4000 mg | ORAL_CAPSULE | Freq: Every day | ORAL | 0 refills | Status: AC

## 2023-05-19 MED ORDER — ONDANSETRON 4 MG PO TBDP: 4.0000 mg | ORAL_TABLET | Freq: Three times a day (TID) | ORAL | 0 refills | Status: AC | PRN

## 2023-05-19 NOTE — ED Triage Notes (Addendum)
Patient to Urgent Care with complaints of back pain/ flank pain. Feels the pain more on her left side. Denies any known injury. Denies any known fevers.  Symptoms started approx two days ago.  Took azo two days ago. Taking ibuprofen. No longer having UTI symptoms.

## 2023-05-19 NOTE — ED Provider Notes (Signed)
Jaime Steele    CSN: [id number] Arrival date & time: 05/19/23  1736      History   Chief Complaint Chief Complaint  Patient presents with   Back Pain    HPI Jaime Steele is a 27 y.o. female.   Patient presents for evaluation of bilateral back pain wrapping around to the flank present for 2 days, left side worse than right.  Pain has been constant, fluctuating in intensity.  Initially had accompanying urinary frequency and incomplete bladder emptying but symptoms resolved after beginning use of Azo.  Associated nausea without vomiting, able to tolerate food and liquids.  First occurrence of symptoms.  Denies dysuria, hematuria or vaginal symptoms, fever.  Additionally has attempted use of ibuprofen 400 mg which was very helpful in managing pain.  History reviewed. No pertinent past medical history.  There are no problems to display for this patient.   History reviewed. No pertinent surgical history.  OB History   No obstetric history on file.      Home Medications    Prior to Admission medications   Medication Sig Start Date End Date Taking? Authorizing Provider  ondansetron (ZOFRAN-ODT) 4 MG disintegrating tablet Take 1 tablet (4 mg total) by mouth every 8 (eight) hours as needed. 05/19/23  Yes Jaime Steele, Jaime Boone, NP  tamsulosin (FLOMAX) 0.4 MG CAPS capsule Take 1 capsule (0.4 mg total) by mouth daily. 05/19/23  Yes Jaime Hoar, NP  aluminum-magnesium hydroxide-simethicone (MAALOX) 200-200-20 MG/5ML SUSP Take 30 mLs 4 (four) times daily -  before meals and at bedtime by mouth. 05/13/17   Jaime Cheek, MD  azithromycin (ZITHROMAX Z-PAK) 250 MG tablet Use as directed for 5 days as on box. Patient not taking: Reported on 05/19/2023 05/15/17   Jaime Provost, MD  diclofenac (VOLTAREN) 75 MG EC tablet Take 1 tablet (75 mg total) by mouth 2 (two) times daily. 04/03/17   Jaime Provost, MD  famotidine (PEPCID) 20 MG tablet Take 1 tablet (20 mg total) 2 (two)  times daily by mouth. Patient not taking: Reported on 05/15/2017 05/13/17   Jaime Cheek, MD  naproxen (NAPROSYN) 500 MG tablet Take 1 tablet (500 mg total) by mouth 2 (two) times daily with a meal. 08/03/18   Jaime Provost, MD  predniSONE (DELTASONE) 20 MG tablet Take two tablets daily for 3 days for oral sore. Patient not taking: Reported on 05/19/2023 05/05/19   Jaime Provost, MD    Family History History reviewed. No pertinent family history.  Social History Social History   Tobacco Use   Smoking status: Never   Smokeless tobacco: Never  Substance Use Topics   Alcohol use: No   Drug use: No     Allergies   Patient has no known allergies.   Review of Systems Review of Systems   Physical Exam Triage Vital Signs ED Triage Vitals  Encounter Vitals Group     BP 05/19/23 1747 128/74     Systolic BP Percentile --      Diastolic BP Percentile --      Pulse Rate 05/19/23 1747 83     Resp 05/19/23 1747 17     Temp 05/19/23 1747 97.9 F (36.6 C)     Temp src --      SpO2 05/19/23 1747 98 %     Weight --      Height --      Head Circumference --      Peak Flow --      Pain  Score 05/19/23 1744 8     Pain Loc --      Pain Education --      Exclude from Growth Chart --    No data found.  Updated Vital Signs BP 128/74   Pulse 83   Temp 97.9 F (36.6 C)   Resp 17   LMP 04/30/2023   SpO2 98%   Visual Acuity Right Eye Distance:   Left Eye Distance:   Bilateral Distance:    Right Eye Near:   Left Eye Near:    Bilateral Near:     Physical Exam Constitutional:      Appearance: Normal appearance.  Eyes:     Extraocular Movements: Extraocular movements intact.  Pulmonary:     Effort: Pulmonary effort is normal.  Abdominal:     General: Abdomen is flat. Bowel sounds are normal.     Palpations: Abdomen is soft.     Tenderness: There is abdominal tenderness. There is right CVA tenderness and left CVA tenderness.  Neurological:     Mental Status: She is  alert and oriented to person, place, and time. Mental status is at baseline.      UC Treatments / Results  Labs (all labs ordered are listed, but only abnormal results are displayed) Labs Reviewed  POCT URINALYSIS DIP (MANUAL ENTRY) - Abnormal; Notable for the following components:      Result Value   Color, UA light yellow (*)    Blood, UA large (*)    Protein Ur, POC =30 (*)    Leukocytes, UA Small (1+) (*)    All other components within normal limits  URINE CULTURE  POCT URINE PREGNANCY    EKG   Radiology No results found.  Procedures Procedures (including critical care time)  Medications Ordered in UC Medications - No data to display  Initial Impression / Assessment and Plan / UC Course  I have reviewed the triage vital signs and the nursing notes.  Pertinent labs & imaging results that were available during my care of the patient were reviewed by me and considered in my medical decision making (see chart for details).  Bilateral flank pain  Tenderness noted bilaterally on exam, no suprapubic tenderness and bowel sounds are normal, urinalysis showing leukocytes and hemoglobin but negative for nitrates, sent for culture, will initiate antibiotic based on culture results, discussed all findings with patient, presentation and symptomology is most consistent with a kidney stone, unable to complete imaging as this is unavailable here today in the clinic, discussed this with patient, will prophylactically initiate Flomax, discussed administration additionally prescribe Zofran, able to manage pain with NSAIDs and advised and given dosage and frequency for ibuprofen, may take Tylenol as needed, follow-up with urology advised if urine culture is negative for infection, given strict ER precautions Final Clinical Impressions(s) / UC Diagnoses   Final diagnoses:  Bilateral flank pain     Discharge Instructions      Today you are being treated prophlyactically for a kidney  stone. In the urgent care setting, I do not have definitive imaging to determine if a kidney stone is present. This decision was made based on your symptoms and urinalysis.  Your urinalysis showed Jaime Steele blood cells and blood but did not show nitrates which is an enzyme released from bacteria, urine has been sent to the lab to see if bacteria will grow, if this occurs you will be notified and antibiotic sent to pharmacy  He may strain your urine in an attempt  to catch the stone, if you are able to catch stone you may take it to your primary doctor or urologist for testing to determine the cause  Take Tamsulosin on every day before bed for 14 days. This medication relaxes the urethra and will allow the stone to move through easier   You may use zofran every 8 hours as needed for nausea.  Use underneath tongue and allow to dissolve then wait 30 minutes to an hour before attempting to eat  You may take be ibuprofen every 6 hours as needed for pain.  May use 400 to 600 mg every 6 hours, may take 800 mg every 8 hours, may take Tylenol in addition to this as needed taking 325 mg to 1000 mg every 6 hours  Please schedule follow-up appointment with urologist for further evaluation and management if urine culture is negative for bacteria and there is no bladder infection, informational on front page  At any point if your pain becomes more severe, urine in blood begins or increases, you become lightheaded or dizzy please go to the nearest emergency department for further evaluation     ED Prescriptions     Medication Sig Dispense Auth. Provider   tamsulosin (FLOMAX) 0.4 MG CAPS capsule Take 1 capsule (0.4 mg total) by mouth daily. 14 capsule Tata Timmins R, NP   ondansetron (ZOFRAN-ODT) 4 MG disintegrating tablet Take 1 tablet (4 mg total) by mouth every 8 (eight) hours as needed. 20 tablet Jaime Hoar, NP      PDMP not reviewed this encounter.   Jaime Hoar, NP 05/19/23 [phone]

## 2023-05-19 NOTE — Discharge Instructions (Addendum)
Today you are being treated prophlyactically for a kidney stone. In the urgent care setting, I do not have definitive imaging to determine if a kidney stone is present. This decision was made based on your symptoms and urinalysis.  Your urinalysis showed Jrake Rodriquez blood cells and blood but did not show nitrates which is an enzyme released from bacteria, urine has been sent to the lab to see if bacteria will grow, if this occurs you will be notified and antibiotic sent to pharmacy  He may strain your urine in an attempt to catch the stone, if you are able to catch stone you may take it to your primary doctor or urologist for testing to determine the cause  Take Tamsulosin on every day before bed for 14 days. This medication relaxes the urethra and will allow the stone to move through easier   You may use zofran every 8 hours as needed for nausea.  Use underneath tongue and allow to dissolve then wait 30 minutes to an hour before attempting to eat  You may take be ibuprofen every 6 hours as needed for pain.  May use 400 to 600 mg every 6 hours, may take 800 mg every 8 hours, may take Tylenol in addition to this as needed taking 325 mg to 1000 mg every 6 hours  Please schedule follow-up appointment with urologist for further evaluation and management if urine culture is negative for bacteria and there is no bladder infection, informational on front page  At any point if your pain becomes more severe, urine in blood begins or increases, you become lightheaded or dizzy please go to the nearest emergency department for further evaluation

## 2023-05-22 ENCOUNTER — Telehealth: Payer: Self-pay | Admitting: Emergency Medicine

## 2023-05-22 LAB — URINE CULTURE: Culture: 40000 — AB

## 2023-05-22 MED ORDER — SULFAMETHOXAZOLE-TRIMETHOPRIM 800-160 MG PO TABS: 1.0000 | ORAL_TABLET | Freq: Two times a day (BID) | ORAL | 0 refills | Status: AC

## 2023-05-22 NOTE — Telephone Encounter (Signed)
Attempted to notify of urine culture results x 1, left voicemail to return call to clinic and also sent message on MyChart, Bactrim sent

## 2023-05-23 ENCOUNTER — Telehealth (HOSPITAL_COMMUNITY): Payer: Self-pay

## 2023-05-23 MED ORDER — NITROFURANTOIN MONOHYD MACRO 100 MG PO CAPS: 100.0000 mg | ORAL_CAPSULE | Freq: Two times a day (BID) | ORAL | 0 refills | Status: AC

## 2023-06-01 NOTE — Telephone Encounter (Signed)
Na
